# Patient Record
Sex: Female | Born: 1965 | Race: Black or African American | Hispanic: No | Smoking: Never smoker
Health system: Southern US, Community
[De-identification: ages and names within clinical notes are randomized; demographics above are authoritative.]

## PROBLEM LIST (undated history)

## (undated) DIAGNOSIS — C50919 Malignant neoplasm of unspecified site of unspecified female breast: Secondary | ICD-10-CM

## (undated) HISTORY — PX: TUBAL LIGATION: SHX77

## (undated) HISTORY — PX: WRIST FRACTURE SURGERY: SHX121

---

## 2005-12-08 ENCOUNTER — Other Ambulatory Visit: Admission: RE | Admit: 2005-12-08 | Discharge: 2005-12-08 | Payer: Self-pay | Admitting: Obstetrics and Gynecology

## 2005-12-30 ENCOUNTER — Encounter: Admission: RE | Admit: 2005-12-30 | Discharge: 2005-12-30 | Payer: Self-pay | Admitting: Obstetrics and Gynecology

## 2006-11-23 ENCOUNTER — Emergency Department (HOSPITAL_COMMUNITY): Admission: EM | Admit: 2006-11-23 | Discharge: 2006-11-23 | Payer: Self-pay | Admitting: Emergency Medicine

## 2008-01-23 ENCOUNTER — Encounter: Admission: RE | Admit: 2008-01-23 | Discharge: 2008-01-23 | Payer: Self-pay | Admitting: Family Medicine

## 2008-01-24 ENCOUNTER — Ambulatory Visit (HOSPITAL_COMMUNITY): Admission: RE | Admit: 2008-01-24 | Discharge: 2008-01-24 | Payer: Self-pay | Admitting: Family Medicine

## 2008-04-03 ENCOUNTER — Other Ambulatory Visit: Admission: RE | Admit: 2008-04-03 | Discharge: 2008-04-03 | Payer: Self-pay | Admitting: Family Medicine

## 2009-08-26 ENCOUNTER — Ambulatory Visit (HOSPITAL_COMMUNITY): Admission: RE | Admit: 2009-08-26 | Discharge: 2009-08-26 | Payer: Self-pay | Admitting: Emergency Medicine

## 2010-01-25 ENCOUNTER — Emergency Department (HOSPITAL_COMMUNITY)
Admission: EM | Admit: 2010-01-25 | Discharge: 2010-01-25 | Payer: Self-pay | Source: Home / Self Care | Admitting: Emergency Medicine

## 2010-11-05 LAB — DIFFERENTIAL
Basophils Relative: 0
Eosinophils Relative: 2
Lymphocytes Relative: 22
Lymphs Abs: 1.3
Monocytes Absolute: 0.3

## 2010-11-05 LAB — POCT I-STAT CREATININE: Operator id: 288831

## 2010-11-05 LAB — CBC
Hemoglobin: 10.2 — ABNORMAL LOW
RDW: 22.5 — ABNORMAL HIGH
WBC: 5.7

## 2010-11-05 LAB — I-STAT 8, (EC8 V) (CONVERTED LAB)
BUN: 11
Bicarbonate: 26.6 — ABNORMAL HIGH
Sodium: 139

## 2010-11-05 LAB — POCT CARDIAC MARKERS
CKMB, poc: <1 — ABNORMAL LOW
Troponin i, poc: <0.05

## 2011-06-05 ENCOUNTER — Other Ambulatory Visit: Payer: Self-pay | Admitting: Internal Medicine

## 2011-06-05 ENCOUNTER — Inpatient Hospital Stay (HOSPITAL_COMMUNITY): Admission: RE | Admit: 2011-06-05 | Payer: Self-pay | Source: Ambulatory Visit

## 2011-06-05 ENCOUNTER — Other Ambulatory Visit (HOSPITAL_COMMUNITY)
Admission: RE | Admit: 2011-06-05 | Discharge: 2011-06-05 | Disposition: A | Discharge status: Home / Self Care | Payer: BC Managed Care – PPO | Source: Ambulatory Visit | Attending: Internal Medicine | Admitting: Internal Medicine

## 2011-06-05 DIAGNOSIS — Z1231 Encounter for screening mammogram for malignant neoplasm of breast: Secondary | ICD-10-CM

## 2011-06-05 DIAGNOSIS — Z01419 Encounter for gynecological examination (general) (routine) without abnormal findings: Secondary | ICD-10-CM | POA: Insufficient documentation

## 2011-06-05 DIAGNOSIS — Z1159 Encounter for screening for other viral diseases: Secondary | ICD-10-CM | POA: Insufficient documentation

## 2011-06-15 ENCOUNTER — Ambulatory Visit
Admission: RE | Admit: 2011-06-15 | Discharge: 2011-06-15 | Disposition: A | Discharge status: Home / Self Care | Payer: BC Managed Care – PPO | Source: Ambulatory Visit | Attending: Internal Medicine | Admitting: Internal Medicine

## 2011-06-15 DIAGNOSIS — Z1231 Encounter for screening mammogram for malignant neoplasm of breast: Secondary | ICD-10-CM

## 2012-09-01 ENCOUNTER — Other Ambulatory Visit: Payer: Self-pay

## 2012-09-09 ENCOUNTER — Other Ambulatory Visit: Payer: Self-pay

## 2012-09-09 DIAGNOSIS — Z1231 Encounter for screening mammogram for malignant neoplasm of breast: Secondary | ICD-10-CM

## 2012-09-12 ENCOUNTER — Ambulatory Visit
Admission: RE | Admit: 2012-09-12 | Discharge: 2012-09-12 | Disposition: A | Discharge status: Home / Self Care | Payer: BC Managed Care – PPO | Source: Ambulatory Visit | Attending: Geriatric Medicine | Admitting: Geriatric Medicine

## 2012-09-12 ENCOUNTER — Other Ambulatory Visit: Payer: Self-pay | Admitting: Geriatric Medicine

## 2012-09-12 DIAGNOSIS — R1310 Dysphagia, unspecified: Secondary | ICD-10-CM

## 2012-09-23 ENCOUNTER — Ambulatory Visit
Admission: RE | Admit: 2012-09-23 | Discharge: 2012-09-23 | Disposition: A | Discharge status: Home / Self Care | Payer: BC Managed Care – PPO | Source: Ambulatory Visit

## 2012-09-23 DIAGNOSIS — Z1231 Encounter for screening mammogram for malignant neoplasm of breast: Secondary | ICD-10-CM

## 2013-11-10 ENCOUNTER — Other Ambulatory Visit: Payer: Self-pay

## 2013-11-10 DIAGNOSIS — Z1231 Encounter for screening mammogram for malignant neoplasm of breast: Secondary | ICD-10-CM

## 2013-11-27 ENCOUNTER — Ambulatory Visit
Admission: RE | Admit: 2013-11-27 | Discharge: 2013-11-27 | Disposition: A | Discharge status: Home / Self Care | Payer: BC Managed Care – PPO | Source: Ambulatory Visit

## 2013-11-27 DIAGNOSIS — Z1231 Encounter for screening mammogram for malignant neoplasm of breast: Secondary | ICD-10-CM

## 2014-07-09 ENCOUNTER — Other Ambulatory Visit (HOSPITAL_COMMUNITY): Payer: Self-pay | Admitting: Gastroenterology

## 2014-07-09 DIAGNOSIS — R1314 Dysphagia, pharyngoesophageal phase: Secondary | ICD-10-CM

## 2014-07-23 ENCOUNTER — Ambulatory Visit (HOSPITAL_COMMUNITY)
Admission: RE | Admit: 2014-07-23 | Discharge: 2014-07-23 | Disposition: A | Discharge status: Home / Self Care | Payer: BC Managed Care – PPO | Source: Ambulatory Visit | Attending: Gastroenterology | Admitting: Gastroenterology

## 2014-07-23 DIAGNOSIS — R1314 Dysphagia, pharyngoesophageal phase: Secondary | ICD-10-CM | POA: Insufficient documentation

## 2015-01-22 ENCOUNTER — Other Ambulatory Visit: Payer: Self-pay

## 2015-01-22 DIAGNOSIS — Z1231 Encounter for screening mammogram for malignant neoplasm of breast: Secondary | ICD-10-CM

## 2015-02-15 ENCOUNTER — Other Ambulatory Visit: Payer: Self-pay | Admitting: Internal Medicine

## 2015-02-15 ENCOUNTER — Other Ambulatory Visit (HOSPITAL_COMMUNITY)
Admission: RE | Admit: 2015-02-15 | Discharge: 2015-02-15 | Disposition: A | Discharge status: Home / Self Care | Payer: BC Managed Care – PPO | Source: Ambulatory Visit | Attending: Internal Medicine | Admitting: Internal Medicine

## 2015-02-15 DIAGNOSIS — Z1151 Encounter for screening for human papillomavirus (HPV): Secondary | ICD-10-CM | POA: Diagnosis present

## 2015-02-15 DIAGNOSIS — Z01419 Encounter for gynecological examination (general) (routine) without abnormal findings: Secondary | ICD-10-CM | POA: Insufficient documentation

## 2015-02-18 ENCOUNTER — Ambulatory Visit
Admission: RE | Admit: 2015-02-18 | Discharge: 2015-02-18 | Disposition: A | Discharge status: Home / Self Care | Payer: BC Managed Care – PPO | Source: Ambulatory Visit

## 2015-02-18 DIAGNOSIS — Z1231 Encounter for screening mammogram for malignant neoplasm of breast: Secondary | ICD-10-CM

## 2015-02-19 LAB — CYTOLOGY - PAP

## 2021-02-04 ENCOUNTER — Other Ambulatory Visit: Payer: Self-pay | Admitting: Family Medicine

## 2021-02-04 DIAGNOSIS — N6321 Unspecified lump in the left breast, upper outer quadrant: Secondary | ICD-10-CM

## 2021-02-19 ENCOUNTER — Other Ambulatory Visit: Payer: Self-pay | Admitting: Hematology and Oncology

## 2021-03-06 ENCOUNTER — Other Ambulatory Visit: Payer: Self-pay | Admitting: Licensed Clinical Social Worker

## 2021-03-06 ENCOUNTER — Encounter: Payer: Self-pay | Admitting: *Deleted

## 2021-03-06 ENCOUNTER — Telehealth: Payer: Self-pay | Admitting: Radiation Oncology

## 2021-03-06 ENCOUNTER — Telehealth: Payer: Self-pay | Admitting: *Deleted

## 2021-03-06 ENCOUNTER — Telehealth: Payer: Self-pay | Admitting: Hematology and Oncology

## 2021-03-06 DIAGNOSIS — Z171 Estrogen receptor negative status [ER-]: Secondary | ICD-10-CM

## 2021-03-06 DIAGNOSIS — C50412 Malignant neoplasm of upper-outer quadrant of left female breast: Secondary | ICD-10-CM

## 2021-03-06 NOTE — Telephone Encounter (Signed)
I called the pt several more times about her scheduled appts with Dr. Lindi Adie and genetics. Was not able to reach the pt, the phone line kept ringing until I eventually got a busy signal. This happened each time I called. I informed the nurse navigators to let them know.

## 2021-03-06 NOTE — Telephone Encounter (Signed)
Received referral from [place], nurse navigator at [facility]. Per Dr. Lindi Adie pt can be seen tomorrow 2/10 at 12:30. Provided Tori with appt date and time as well as requested new pt coordinator to reach out to pt.

## 2021-03-06 NOTE — Progress Notes (Signed)
error 

## 2021-03-06 NOTE — Telephone Encounter (Signed)
Called patient to schedule a consultation w. Dr. Sondra Come. No answer, unable to LVM.

## 2021-03-06 NOTE — Progress Notes (Incomplete)
[facility] NOTE  Patient Care Team: Mauro Kaufmann, RN as Oncology Nurse Navigator Rockwell Germany, RN as Oncology Nurse Navigator  CHIEF COMPLAINTS/PURPOSE OF CONSULTATION:  Newly diagnosed left breast cancer  HISTORY OF PRESENTING ILLNESS:  Amanda Smith 56 y.o. female is here because of recent diagnosis of left breast cancer. She presented with a palpable solid mass in the left upper outer quadrant. Diagnostic mammogram on 02/12/2021 showed a 3 cm mass in the left upper outer quadrant. 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 She presents to the clinic today for initial evaluation and discussion of treatment options.   I reviewed her records extensively and collaborated the history with the patient.  SUMMARY OF ONCOLOGIC HISTORY: Oncology History   No history exists.    MEDICAL HISTORY:  No past medical history on file.  SURGICAL HISTORY: *** The histories are not reviewed yet. Please review them in the "History" navigator section and refresh this [place].  SOCIAL HISTORY: Social History   Socioeconomic History   Marital status: Married    Spouse name: Not on file   Number of children: Not on file   Years of education: Not on file   Highest education level: Not on file  Occupational History   Not on file  Tobacco Use   Smoking status: Not on file   Smokeless tobacco: Not on file  Substance and Sexual Activity   Alcohol use: Not on file   Drug use: Not on file   Sexual activity: Not on file  Other Topics Concern   Not on file  Social History Narrative   Not on file   Social Determinants of Health   Financial Resource Strain: Not on file  Food Insecurity: Not on file  Transportation Needs: Not on file  Physical Activity: Not on file  Stress: Not on file  Social Connections: Not on file  Intimate Partner Violence: Not on file    FAMILY HISTORY: No family history on file.  ALLERGIES:  has No  Known Allergies.  MEDICATIONS:  No current outpatient medications on file.   No current facility-administered medications for this visit.    REVIEW OF SYSTEMS:   Constitutional: Denies fevers, chills or abnormal night sweats Eyes: Denies blurriness of vision, double vision or watery eyes Ears, nose, mouth, throat, and face: Denies mucositis or sore throat Respiratory: Denies cough, dyspnea or wheezes Cardiovascular: Denies palpitation, chest discomfort or lower extremity swelling Gastrointestinal:  Denies nausea, heartburn or change in bowel habits Skin: Denies abnormal skin rashes Lymphatics: Denies new lymphadenopathy or easy bruising Neurological:Denies numbness, tingling or new weaknesses Behavioral/Psych: Mood is stable, no new changes  Breast: *** Denies any palpable lumps or discharge All other systems were reviewed with the patient and are negative.  PHYSICAL EXAMINATION: ECOG PERFORMANCE STATUS: {CHL ONC ECOG PS:[id number]}  There were no vitals filed for this visit. There were no vitals filed for this visit.  GENERAL:alert, no distress and comfortable SKIN: skin color, texture, turgor are normal, no rashes or significant lesions EYES: normal, conjunctiva are pink and non-injected, sclera clear OROPHARYNX:no exudate, no erythema and lips, buccal mucosa, and tongue normal  NECK: supple, thyroid normal size, non-tender, without nodularity LYMPH:  no palpable lymphadenopathy in the cervical, axillary or inguinal LUNGS: clear to auscultation and percussion with normal breathing effort HEART: regular rate & rhythm and no murmurs and no lower extremity edema ABDOMEN:abdomen soft, non-tender and normal bowel sounds Musculoskeletal:no cyanosis of digits and no clubbing  PSYCH: alert & oriented x 3 with fluent speech NEURO: no focal motor/sensory deficits BREAST:*** No palpable nodules in breast. No palpable axillary or supraclavicular lymphadenopathy (exam performed in the  presence of a chaperone)   LABORATORY DATA:  I have reviewed the data as listed Lab Results  Component Value Date   WBC 5.7 11/23/2006   HGB 12.6 11/23/2006   HCT 37.0 11/23/2006   MCV 81.1 11/23/2006   PLT 251 11/23/2006   Lab Results  Component Value Date   NA 139 11/23/2006   K 4.2 11/23/2006   CL 107 11/23/2006    RADIOGRAPHIC STUDIES: I have personally reviewed the radiological reports and agreed with the findings in the report.  ASSESSMENT AND PLAN:  No problem-specific Assessment & Plan notes found for this encounter.   All questions were answered. The patient knows to call the clinic with any problems, questions or concerns.   Rulon Eisenmenger, MD, MPH 03/06/2021    I, Thana Ates, am acting as scribe for Nicholas Lose, MD.  {Add scribe attestation statement}

## 2021-03-07 ENCOUNTER — Telehealth: Payer: Self-pay | Admitting: *Deleted

## 2021-03-07 ENCOUNTER — Telehealth: Payer: Self-pay | Admitting: Radiation Oncology

## 2021-03-07 ENCOUNTER — Encounter: Payer: Self-pay | Admitting: Hematology and Oncology

## 2021-03-07 ENCOUNTER — Other Ambulatory Visit: Payer: Self-pay

## 2021-03-07 ENCOUNTER — Inpatient Hospital Stay: Payer: PRIVATE HEALTH INSURANCE | Admitting: Hematology and Oncology

## 2021-03-07 ENCOUNTER — Encounter: Payer: Self-pay | Admitting: *Deleted

## 2021-03-07 ENCOUNTER — Inpatient Hospital Stay: Payer: PRIVATE HEALTH INSURANCE | Attending: Hematology and Oncology | Admitting: Hematology and Oncology

## 2021-03-07 VITALS — BP 91/65 | HR 96 | Temp 97.9°F | Resp 16 | Ht 64.0 in | Wt 117.0 lb

## 2021-03-07 DIAGNOSIS — C50412 Malignant neoplasm of upper-outer quadrant of left female breast: Secondary | ICD-10-CM | POA: Insufficient documentation

## 2021-03-07 DIAGNOSIS — Z171 Estrogen receptor negative status [ER-]: Secondary | ICD-10-CM | POA: Insufficient documentation

## 2021-03-07 NOTE — Telephone Encounter (Signed)
Called patient to schedule a consultation w. Dr. Sondra Come. No answer, unable to LVM.

## 2021-03-07 NOTE — Telephone Encounter (Signed)
Several attempts made to reach pt to discuss new pt appt with Dr. Lindi Adie on 2/10 at 12:30. Reached out to Engineer, manufacturing at [facility], she was able to reach the pt and requested pt return our calls. Pt did call back, provided appt with Dr. Lindi Adie on 2/10 at 12:30. After much discussion, pt informed she prefer to see a female doctor but would keep appt with Dr. Lindi Adie if appt couldn't be make. Dr. Chryl Heck agree to see pt on 2/10 at 12:30pm. Provided pt with this information.  No further needs or questions at this time.

## 2021-03-07 NOTE — Progress Notes (Signed)
[facility] CONSULT NOTE  Patient Care Team: Pa, [place] as PCP - General (Internal Medicine) Mauro Kaufmann, RN as Oncology Nurse Navigator Rockwell Germany, RN as Oncology Nurse Navigator  CHIEF COMPLAINTS/PURPOSE OF CONSULTATION:  Newly diagnosed breast cancer  HISTORY OF PRESENTING ILLNESS:  Amanda Smith 56 y.o. female is here because of recent diagnosis of left breast cancer  I reviewed her records extensively and collaborated the history with the patient.  SUMMARY OF ONCOLOGIC HISTORY: Oncology History  Malignant neoplasm of upper-outer quadrant of left breast in female, estrogen receptor negative ([facility])  02/12/2021 Initial Diagnosis   palpable solid mass in the left upper outer quadrant. Diagnostic mammogram on 02/12/2021 showed a 3 cm mass in the left upper outer quadrant. Biopsy on 02/20/2021 showed grade 3 invasive ductal carcinoma, ER/PR/Her2-([facility])   03/07/2021 Cancer Staging   Staging form: Breast, AJCC 8th Edition - Clinical stage from 03/07/2021: Stage IIB (cT2, cN0, cM0, G3, ER-, PR-, HER2-) - Signed by Nicholas Lose, MD on 03/07/2021 Stage prefix: Initial diagnosis Histologic grading system: 3 grade system    03/14/2021 -  Chemotherapy   Patient is on Treatment Plan : BREAST Pembrolizumab (200) D1 + Carboplatin (1.5) D1,8,15 + Paclitaxel (80) D1,8,15 q21d X 4 cycles / Pembrolizumab (200) D1 + AC D1 q21d x 4 cycles      [place] left axilla normal with no adenopathy identified. She has felt this lump in the left breast back in December, according to the patient this has grown progressively larger.  She has also lost some weight but this could be because of the change in her dietary habits.  Besides this, she denies any other new complaints.  She is healthy otherwise, works as a Pharmacist, hospital, has her own business.   Family history significant for breast cancer in sister who died at the age of 47 with metastatic breast cancer.  MEDICAL HISTORY:   History reviewed. No pertinent past medical history. No PMH noted.  SURGICAL HISTORY:  She had C sections, no other major surgeries.  SOCIAL HISTORY: Social History   Socioeconomic History   Marital status: Married    Spouse name: Not on file   Number of children: Not on file   Years of education: Not on file   Highest education level: Not on file  Occupational History   Not on file  Tobacco Use   Smoking status: Not on file   Smokeless tobacco: Not on file  Substance and Sexual Activity   Alcohol use: Not on file   Drug use: Not on file   Sexual activity: Not on file  Other Topics Concern   Not on file  Social History Narrative   Not on file   Social Determinants of Health   Financial Resource Strain: Not on file  Food Insecurity: Not on file  Transportation Needs: Not on file  Physical Activity: Not on file  Stress: Not on file  Social Connections: Not on file  Intimate Partner Violence: Not on file    FAMILY HISTORY: Sister had breast cancer, died at 34 from breast cancer NO FH of ovarian cancer, female breast cancer, metastatic prostate cancer.  ALLERGIES:  has No Known Allergies.  MEDICATIONS:  No current outpatient medications on file.   No current facility-administered medications for this visit.    REVIEW OF SYSTEMS:   Constitutional: Denies fevers, chills or abnormal night sweats Eyes: Denies blurriness of vision, double vision or watery eyes Ears, nose, mouth, throat, and  face: Denies mucositis or sore throat Respiratory: Denies cough, dyspnea or wheezes Cardiovascular: Denies palpitation, chest discomfort or lower extremity swelling Gastrointestinal:  Denies nausea, heartburn or change in bowel habits Skin: Denies abnormal skin rashes Lymphatics: Denies new lymphadenopathy or easy bruising Neurological:Denies numbness, tingling or new weaknesses Behavioral/Psych: Mood is stable, no new changes  Breast:  palpated left breast lump 2 months  ago All other systems were reviewed with the patient and are negative.  PHYSICAL EXAMINATION: ECOG PERFORMANCE STATUS: 0 - Asymptomatic  Vitals:   03/07/21 1248  BP: 91/65  Pulse: 96  Resp: 16  Temp: 97.9 F (36.6 C)  SpO2: 100%   Filed Weights   03/07/21 1248  Weight: 117 lb (53.1 kg)    GENERAL:alert, no distress and comfortable SKIN: skin color, texture, turgor are normal, no rashes or significant lesions EYES: normal, conjunctiva are pink and non-injected, sclera clear OROPHARYNX:no exudate, no erythema and lips, buccal mucosa, and tongue normal  NECK: supple, thyroid normal size, non-tender, without nodularity LYMPH:  no palpable lymphadenopathy in the cervical, axillary LUNGS: clear to auscultation and percussion with normal breathing effort HEART: regular rate & rhythm and no murmurs and no lower extremity edema ABDOMEN:abdomen soft, non-tender and normal bowel sounds Musculoskeletal:no cyanosis of digits and no clubbing  PSYCH: alert & oriented x 3 with fluent speech NEURO: no focal motor/sensory deficits BREAST: Palpable left breast upper outer lump measuring about 3-4 cms, palpable left axillary LN, likely non pathologic. NO other regional adenopathy  LABORATORY DATA:  I have reviewed the data as listed Lab Results  Component Value Date   WBC 5.7 11/23/2006   HGB 12.6 11/23/2006   HCT 37.0 11/23/2006   MCV 81.1 11/23/2006   PLT 251 11/23/2006   Lab Results  Component Value Date   NA 139 11/23/2006   K 4.2 11/23/2006   CL 107 11/23/2006    RADIOGRAPHIC STUDIES: I have personally reviewed the radiological reports and agreed with the findings in the report.  ASSESSMENT AND PLAN:  This is a very plan 56 year old postmenopausal female patient with newly diagnosed left breast triple negative invasive ductal carcinoma measuring about 3 cm on imaging, no evidence of pathological left axillary lymphadenopathy on imaging referred to medical oncology for  recommendations.  Pathology and radiology counseling: Discussed with the patient, the details of pathology including the type of breast cancer,the clinical staging, the significance of ER, PR and HER-2/neu receptors and the implications for treatment. After reviewing the pathology in detail, we proceeded to discuss the different treatment options between surgery, radiation, chemotherapy, antiestrogen therapies.   Recommendation  1. Neoadjuvant chemotherapy with  Taxol weekly 12 with carboplatin and Keytruda every 3 weeks x4, followed by Adriamycin and Cytoxan with Keytruda every 3 weeks followed by Beryle Flock maintenance for 1 year ( Adjuvant 9 cycles) 2. Followed by breast conserving surgery with sentinel lymph node study 3. Followed by adjuvant radiation therapy No role for antiestrogen therapy since this is a triple negative breast cancer   Chemotherapy Counseling: I discussed the risks and benefits of chemotherapy including the risks of nausea/ vomiting, risk of infection from low WBC count, fatigue due to chemo or anemia, bruising or bleeding due to low platelets, mouth sores, loss/ change in taste and decreased appetite. Liver and kidney function will be monitored through out chemotherapy as abnormalities in liver and kidney function may be a side effect of treatment.  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  Risk of permanent bone marrow dysfunction due to chemo were also discussed.  We also discussed the immune mediated adverse effects related to [facility]   Plan: 1. Port placement to be done 2. Echocardiogram 3. Chemotherapy class 4. Breast MRI  Genetic counseling recommended.    Return to clinic in 2 weeks to start chemotherapy. She said its important for her to have a provider who is a christian like her and she would like think about all the information provided and will let us know if she would like to proceed with the  recommendations as mentioned above.   All questions were answered. The patient knows to call the clinic with any problems, questions or concerns.    Benay Pike, MD 03/07/21

## 2021-03-07 NOTE — Assessment & Plan Note (Deleted)
02/12/2021:palpable solid mass in the left upper outer quadrant. Diagnostic mammogram on 02/12/2021 showed a 3 cm mass in the left upper outer quadrant. Biopsy on 02/20/2021 showed grade 3 invasive ductal carcinoma, ER/PR/Her2-([facility])  Pathology and radiology counseling: Discussed with the patient, the details of pathology including the type of breast cancer,the clinical staging, the significance of ER, PR and HER-2/neu receptors and the implications for treatment. After reviewing the pathology in detail, we proceeded to discuss the different treatment options between surgery, radiation, chemotherapy, antiestrogen therapies.  Recommendation based on multidisciplinary tumor board: 1. Neoadjuvant chemotherapy with Adriamycin and Cytoxan Keytruda 4 followed by Taxol weekly 12 with carboplatin Keytruda every 3 weeks x4 followed by Beryle Flock maintenance 2. Followed by breast conserving surgery with sentinel lymph node study vs targeted axillary dissection 3. Followed by adjuvant radiation therapy  Chemotherapy Counseling: I discussed the risks and benefits of chemotherapy including the risks of nausea/ vomiting, risk of infection from low WBC count, fatigue due to chemo or anemia, bruising or bleeding due to low platelets, mouth sores, loss/ change in taste and decreased appetite. Liver and kidney function will be monitored through out chemotherapy as abnormalities in liver and kidney function may be a side effect of treatment.  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 Risk of permanent bone marrow dysfunction due to chemo were also discussed.  Plan: 1. Port placement to be done next Monday 2. Echocardiogram 3. Chemotherapy class 4. Breast MRI 5. CT chest abdomen pelvis and bone scan for staging Genetic counseling will also be arranged  URCC 16070: Treatment of refractory nausea.  After first cycle of chemo if patient experience chemo  induced nausea and vomiting the randomized from cycle 2 to Aloxi plus Dex plus olanzapine or placebo plus Compazine or placebo plus placebo prior to chemo and take home medications for day 2 today for of Dex plus olanzapine or placebo and Compazine or placebo every 8 hours.  If patient does not have nausea after cycle 1, then the trial is complete.  Return to clinic in 1 week to start chemotherapy.

## 2021-03-07 NOTE — Progress Notes (Signed)
START ON PATHWAY REGIMEN - Breast     Cycles 1 through 4: A cycle is every 21 days:     Pembrolizumab      Paclitaxel      Carboplatin      Filgrastim-xxxx    Cycles 5 through 8: A cycle is every 21 days:     Pembrolizumab      Doxorubicin      Cyclophosphamide      Pegfilgrastim-xxxx   **Always confirm dose/schedule in your pharmacy ordering system**  Patient Characteristics: Preoperative or Nonsurgical Candidate (Clinical Staging), Neoadjuvant Therapy followed by Surgery, Invasive Disease, Chemotherapy, HER2 Negative/Unknown/Equivocal, ER Negative/Unknown, Platinum Therapy Indicated and Candidate for Checkpoint Inhibitor Therapeutic Status: Preoperative or Nonsurgical Candidate (Clinical Staging) AJCC M Category: cM0 AJCC Grade: G3 Breast Surgical Plan: Neoadjuvant Therapy followed by Surgery ER Status: Negative (-) AJCC 8 Stage Grouping: IIB HER2 Status: Negative (-) AJCC T Category: cT2 AJCC N Category: cN0 PR Status: Negative (-) Intent of Therapy: Curative Intent, Discussed with Patient

## 2021-03-07 NOTE — Telephone Encounter (Signed)
Called pt to ensure she was coming to her appointment to see Dr. Chryl Heck. No answer and unable to leave a vm. Sent email to verified email as well. No response.

## 2021-03-10 ENCOUNTER — Ambulatory Visit
Admission: RE | Admit: 2021-03-10 | Discharge: 2021-03-10 | Disposition: A | Discharge status: Home / Self Care | Payer: Self-pay | Source: Ambulatory Visit | Attending: Radiation Oncology | Admitting: Radiation Oncology

## 2021-03-10 ENCOUNTER — Inpatient Hospital Stay
Admission: RE | Admit: 2021-03-10 | Discharge: 2021-03-10 | Disposition: A | Discharge status: Home / Self Care | Payer: Self-pay | Source: Ambulatory Visit | Attending: Radiation Oncology | Admitting: Radiation Oncology

## 2021-03-10 ENCOUNTER — Inpatient Hospital Stay: Payer: PRIVATE HEALTH INSURANCE

## 2021-03-10 ENCOUNTER — Other Ambulatory Visit: Payer: Self-pay | Admitting: Radiation Oncology

## 2021-03-10 ENCOUNTER — Inpatient Hospital Stay: Payer: PRIVATE HEALTH INSURANCE | Admitting: Licensed Clinical Social Worker

## 2021-03-10 ENCOUNTER — Other Ambulatory Visit: Payer: BC Managed Care – PPO

## 2021-03-10 ENCOUNTER — Encounter: Payer: Self-pay | Admitting: *Deleted

## 2021-03-10 DIAGNOSIS — C50412 Malignant neoplasm of upper-outer quadrant of left female breast: Secondary | ICD-10-CM

## 2021-03-10 DIAGNOSIS — Z171 Estrogen receptor negative status [ER-]: Secondary | ICD-10-CM

## 2021-03-11 ENCOUNTER — Telehealth: Payer: Self-pay | Admitting: *Deleted

## 2021-03-11 NOTE — Telephone Encounter (Signed)
Spoke to pt regarding provider. She would like to stay with Dr. Chryl Heck. Pt unable to attend appt on 2/21 at 4pm. Scheduled and confirmed new time of 8:30am. Discussed appt with Dr. Donne Hazel on 2/17. No further needs voiced at this time.

## 2021-03-11 NOTE — Telephone Encounter (Signed)
Pt left vm to further discuss med onc options. Return pt call. Left vm requesting return call. Contact information provided.

## 2021-03-14 ENCOUNTER — Encounter: Payer: Self-pay | Admitting: *Deleted

## 2021-03-14 ENCOUNTER — Telehealth: Payer: Self-pay | Admitting: *Deleted

## 2021-03-14 ENCOUNTER — Other Ambulatory Visit: Payer: Self-pay | Admitting: General Surgery

## 2021-03-14 LAB — SURGICAL PATHOLOGY

## 2021-03-14 NOTE — Telephone Encounter (Signed)
Pt was no show for 2/13 genetics appt. While pt was at Dr. Cristal Generous office, genetic testing was performed.

## 2021-03-17 ENCOUNTER — Other Ambulatory Visit: Payer: Self-pay

## 2021-03-17 ENCOUNTER — Encounter (HOSPITAL_BASED_OUTPATIENT_CLINIC_OR_DEPARTMENT_OTHER): Payer: Self-pay | Admitting: General Surgery

## 2021-03-18 ENCOUNTER — Other Ambulatory Visit: Payer: Self-pay

## 2021-03-18 ENCOUNTER — Ambulatory Visit (HOSPITAL_COMMUNITY): Payer: Self-pay

## 2021-03-18 ENCOUNTER — Inpatient Hospital Stay (HOSPITAL_BASED_OUTPATIENT_CLINIC_OR_DEPARTMENT_OTHER): Payer: PRIVATE HEALTH INSURANCE | Admitting: Hematology and Oncology

## 2021-03-18 ENCOUNTER — Ambulatory Visit (HOSPITAL_BASED_OUTPATIENT_CLINIC_OR_DEPARTMENT_OTHER): Payer: Self-pay | Admitting: Anesthesiology

## 2021-03-18 ENCOUNTER — Other Ambulatory Visit: Payer: Self-pay | Admitting: Hematology and Oncology

## 2021-03-18 ENCOUNTER — Encounter (HOSPITAL_BASED_OUTPATIENT_CLINIC_OR_DEPARTMENT_OTHER): Payer: Self-pay | Admitting: General Surgery

## 2021-03-18 ENCOUNTER — Encounter: Payer: Self-pay | Admitting: Hematology and Oncology

## 2021-03-18 ENCOUNTER — Encounter (HOSPITAL_BASED_OUTPATIENT_CLINIC_OR_DEPARTMENT_OTHER)
Admission: RE | Disposition: A | Discharge status: Home / Self Care | Payer: Self-pay | Source: Home / Self Care | Attending: General Surgery

## 2021-03-18 ENCOUNTER — Ambulatory Visit: Payer: BC Managed Care – PPO | Admitting: Hematology and Oncology

## 2021-03-18 ENCOUNTER — Ambulatory Visit (HOSPITAL_BASED_OUTPATIENT_CLINIC_OR_DEPARTMENT_OTHER)
Admission: RE | Admit: 2021-03-18 | Discharge: 2021-03-18 | Disposition: A | Discharge status: Home / Self Care | Payer: Self-pay | Attending: General Surgery | Admitting: General Surgery

## 2021-03-18 ENCOUNTER — Telehealth: Payer: Self-pay | Admitting: Hematology and Oncology

## 2021-03-18 ENCOUNTER — Encounter: Payer: Self-pay | Admitting: *Deleted

## 2021-03-18 VITALS — BP 110/62 | HR 84 | Temp 97.9°F | Resp 16 | Ht 62.0 in | Wt 115.8 lb

## 2021-03-18 DIAGNOSIS — Z171 Estrogen receptor negative status [ER-]: Secondary | ICD-10-CM | POA: Insufficient documentation

## 2021-03-18 DIAGNOSIS — C50912 Malignant neoplasm of unspecified site of left female breast: Secondary | ICD-10-CM | POA: Diagnosis not present

## 2021-03-18 DIAGNOSIS — C50412 Malignant neoplasm of upper-outer quadrant of left female breast: Secondary | ICD-10-CM | POA: Diagnosis not present

## 2021-03-18 DIAGNOSIS — Z803 Family history of malignant neoplasm of breast: Secondary | ICD-10-CM | POA: Insufficient documentation

## 2021-03-18 HISTORY — PX: PORTACATH PLACEMENT: SHX2246

## 2021-03-18 HISTORY — DX: Malignant neoplasm of unspecified site of unspecified female breast: C50.919

## 2021-03-18 SURGERY — INSERTION, TUNNELED CENTRAL VENOUS DEVICE, WITH PORT
Anesthesia: General | Site: Chest | Laterality: Right

## 2021-03-18 MED ORDER — HEPARIN SOD (PORK) LOCK FLUSH 100 UNIT/ML IV SOLN
INTRAVENOUS | Status: DC | PRN
  Administered 2021-03-18: 4.5 [IU] via INTRAVENOUS

## 2021-03-18 MED ORDER — HEPARIN (PORCINE) IN NACL 2-0.9 UNITS/ML
INTRAMUSCULAR | Status: AC | PRN
Start: 2021-03-18 — End: 2021-03-18
  Administered 2021-03-18: 1

## 2021-03-18 MED ORDER — LIDOCAINE HCL (CARDIAC) PF 100 MG/5ML IV SOSY
PREFILLED_SYRINGE | INTRAVENOUS | Status: DC | PRN
Start: 2021-03-18 — End: 2021-03-18
  Administered 2021-03-18: 60 mg via INTRAVENOUS

## 2021-03-18 MED ORDER — AMISULPRIDE (ANTIEMETIC) 5 MG/2ML IV SOLN: 10.0000 mg | Freq: Once | INTRAVENOUS | Status: DC | PRN

## 2021-03-18 MED ORDER — HEPARIN SOD (PORK) LOCK FLUSH 100 UNIT/ML IV SOLN
INTRAVENOUS | Status: AC
  Filled 2021-03-18: qty 5

## 2021-03-18 MED ORDER — ONDANSETRON HCL 4 MG/2ML IJ SOLN
INTRAMUSCULAR | Status: AC
  Filled 2021-03-18: qty 2

## 2021-03-18 MED ORDER — CHLORHEXIDINE GLUCONATE CLOTH 2 % EX PADS: 6.0000 | MEDICATED_PAD | Freq: Once | CUTANEOUS | Status: DC

## 2021-03-18 MED ORDER — LIDOCAINE-PRILOCAINE 2.5-2.5 % EX CREA: TOPICAL_CREAM | CUTANEOUS | 3 refills | Status: AC

## 2021-03-18 MED ORDER — PHENYLEPHRINE HCL (PRESSORS) 10 MG/ML IV SOLN
INTRAVENOUS | Status: DC | PRN
  Administered 2021-03-18 (×2): 80 ug via INTRAVENOUS

## 2021-03-18 MED ORDER — LIDOCAINE 2% (20 MG/ML) 5 ML SYRINGE
INTRAMUSCULAR | Status: AC
  Filled 2021-03-18: qty 5

## 2021-03-18 MED ORDER — MEPERIDINE HCL 25 MG/ML IJ SOLN: 6.2500 mg | INTRAMUSCULAR | Status: DC | PRN

## 2021-03-18 MED ORDER — OXYCODONE HCL 5 MG PO TABS: 5.0000 mg | ORAL_TABLET | Freq: Once | ORAL | Status: DC | PRN

## 2021-03-18 MED ORDER — ACETAMINOPHEN 500 MG PO TABS
ORAL_TABLET | ORAL | Status: AC
  Filled 2021-03-18: qty 2

## 2021-03-18 MED ORDER — FENTANYL CITRATE (PF) 100 MCG/2ML IJ SOLN
INTRAMUSCULAR | Status: DC | PRN
  Administered 2021-03-18: 50 ug via INTRAVENOUS

## 2021-03-18 MED ORDER — PROPOFOL 10 MG/ML IV BOLUS
INTRAVENOUS | Status: DC | PRN
  Administered 2021-03-18: 150 mg via INTRAVENOUS

## 2021-03-18 MED ORDER — PROCHLORPERAZINE MALEATE 10 MG PO TABS: 10.0000 mg | ORAL_TABLET | Freq: Four times a day (QID) | ORAL | 1 refills | Status: AC | PRN

## 2021-03-18 MED ORDER — BUPIVACAINE HCL (PF) 0.25 % IJ SOLN
INTRAMUSCULAR | Status: DC | PRN
  Administered 2021-03-18: 5 mL

## 2021-03-18 MED ORDER — PROPOFOL 10 MG/ML IV BOLUS
INTRAVENOUS | Status: AC
  Filled 2021-03-18: qty 20

## 2021-03-18 MED ORDER — OXYCODONE HCL 5 MG/5ML PO SOLN: 5.0000 mg | Freq: Once | ORAL | Status: DC | PRN

## 2021-03-18 MED ORDER — DEXAMETHASONE SODIUM PHOSPHATE 4 MG/ML IJ SOLN
INTRAMUSCULAR | Status: DC | PRN
  Administered 2021-03-18: 4 mg via INTRAVENOUS

## 2021-03-18 MED ORDER — ONDANSETRON HCL 8 MG PO TABS: 8.0000 mg | ORAL_TABLET | Freq: Two times a day (BID) | ORAL | 1 refills | Status: AC | PRN

## 2021-03-18 MED ORDER — TRAMADOL HCL 50 MG PO TABS: 100.0000 mg | ORAL_TABLET | Freq: Four times a day (QID) | ORAL | 0 refills | Status: AC | PRN

## 2021-03-18 MED ORDER — MIDAZOLAM HCL 2 MG/2ML IJ SOLN
INTRAMUSCULAR | Status: AC
  Filled 2021-03-18: qty 2

## 2021-03-18 MED ORDER — MIDAZOLAM HCL 5 MG/5ML IJ SOLN
INTRAMUSCULAR | Status: DC | PRN
Start: 2021-03-18 — End: 2021-03-18
  Administered 2021-03-18: 2 mg via INTRAVENOUS

## 2021-03-18 MED ORDER — FENTANYL CITRATE (PF) 100 MCG/2ML IJ SOLN
INTRAMUSCULAR | Status: AC
  Filled 2021-03-18: qty 2

## 2021-03-18 MED ORDER — ENSURE PRE-SURGERY PO LIQD: 296.0000 mL | Freq: Once | ORAL | Status: DC

## 2021-03-18 MED ORDER — HEPARIN (PORCINE) IN NACL 1000-0.9 UT/500ML-% IV SOLN
INTRAVENOUS | Status: AC
  Filled 2021-03-18: qty 500

## 2021-03-18 MED ORDER — CEFAZOLIN SODIUM-DEXTROSE 2-4 GM/100ML-% IV SOLN
2.0000 g | INTRAVENOUS | Status: AC
Start: 2021-03-19 — End: 2021-03-18
  Administered 2021-03-18: 2 g via INTRAVENOUS

## 2021-03-18 MED ORDER — PROMETHAZINE HCL 25 MG/ML IJ SOLN: 6.2500 mg | INTRAMUSCULAR | Status: DC | PRN

## 2021-03-18 MED ORDER — DEXAMETHASONE 4 MG PO TABS: 8.0000 mg | ORAL_TABLET | Freq: Every day | ORAL | 1 refills | Status: AC

## 2021-03-18 MED ORDER — ACETAMINOPHEN 500 MG PO TABS
1000.0000 mg | ORAL_TABLET | ORAL | Status: AC
  Administered 2021-03-18: 1000 mg via ORAL

## 2021-03-18 MED ORDER — EPHEDRINE SULFATE (PRESSORS) 50 MG/ML IJ SOLN
INTRAMUSCULAR | Status: DC | PRN
Start: 2021-03-18 — End: 2021-03-18
  Administered 2021-03-18: 10 mg via INTRAVENOUS

## 2021-03-18 MED ORDER — CEFAZOLIN SODIUM-DEXTROSE 2-4 GM/100ML-% IV SOLN
INTRAVENOUS | Status: AC
  Filled 2021-03-18: qty 100

## 2021-03-18 MED ORDER — BUPIVACAINE HCL (PF) 0.25 % IJ SOLN
INTRAMUSCULAR | Status: AC
  Filled 2021-03-18: qty 30

## 2021-03-18 MED ORDER — HYDROMORPHONE HCL 1 MG/ML IJ SOLN: 0.2500 mg | INTRAMUSCULAR | Status: DC | PRN

## 2021-03-18 MED ORDER — DEXAMETHASONE SODIUM PHOSPHATE 10 MG/ML IJ SOLN
INTRAMUSCULAR | Status: AC
  Filled 2021-03-18: qty 1

## 2021-03-18 MED ORDER — LACTATED RINGERS IV SOLN: INTRAVENOUS | Status: DC

## 2021-03-18 SURGICAL SUPPLY — 36 items
ADH SKN CLS APL DERMABOND .7 (GAUZE/BANDAGES/DRESSINGS) ×1
APL PRP STRL LF DISP 70% ISPRP (MISCELLANEOUS) ×1
BAG DECANTER FOR FLEXI CONT (MISCELLANEOUS) ×3 IMPLANT
BLADE SURG 11 STRL SS (BLADE) ×3 IMPLANT
BLADE SURG 15 STRL LF DISP TIS (BLADE) ×2 IMPLANT
BLADE SURG 15 STRL SS (BLADE) ×2
CHLORAPREP W/TINT 26 (MISCELLANEOUS) ×3 IMPLANT
COVER BACK TABLE 60X90IN (DRAPES) ×3 IMPLANT
COVER MAYO STAND STRL (DRAPES) ×3 IMPLANT
COVER PROBE 5X48 (MISCELLANEOUS) ×2
DERMABOND ADVANCED (GAUZE/BANDAGES/DRESSINGS) ×1
DERMABOND ADVANCED .7 DNX12 (GAUZE/BANDAGES/DRESSINGS) ×2 IMPLANT
DRAPE C-ARM 42X72 X-RAY (DRAPES) ×3 IMPLANT
DRAPE LAPAROSCOPIC ABDOMINAL (DRAPES) ×3 IMPLANT
DRAPE UTILITY XL STRL (DRAPES) ×3 IMPLANT
ELECT COATED BLADE 2.86 ST (ELECTRODE) ×3 IMPLANT
ELECT REM PT RETURN 9FT ADLT (ELECTROSURGICAL) ×2
ELECTRODE REM PT RTRN 9FT ADLT (ELECTROSURGICAL) ×2 IMPLANT
GLOVE SURG ENC MOIS LTX SZ7 (GLOVE) ×3 IMPLANT
GLOVE SURG UNDER POLY LF SZ7.5 (GLOVE) ×3 IMPLANT
GOWN STRL REUS W/ TWL LRG LVL3 (GOWN DISPOSABLE) ×4 IMPLANT
GOWN STRL REUS W/TWL LRG LVL3 (GOWN DISPOSABLE) ×4
KIT CVR 48X5XPRB PLUP LF (MISCELLANEOUS) IMPLANT
KIT PORT POWER 8FR ISP CVUE (Port) ×1 IMPLANT
NDL HYPO 25X1 1.5 SAFETY (NEEDLE) ×2 IMPLANT
NEEDLE HYPO 25X1 1.5 SAFETY (NEEDLE) ×2 IMPLANT
PACK BASIN DAY SURGERY FS (CUSTOM PROCEDURE TRAY) ×3 IMPLANT
PENCIL SMOKE EVACUATOR (MISCELLANEOUS) ×3 IMPLANT
SLEEVE SCD COMPRESS KNEE MED (STOCKING) ×3 IMPLANT
SUT MNCRL AB 4-0 PS2 18 (SUTURE) ×3 IMPLANT
SUT PROLENE 2 0 SH DA (SUTURE) ×3 IMPLANT
SUT VIC AB 3-0 SH 27 (SUTURE) ×2
SUT VIC AB 3-0 SH 27X BRD (SUTURE) ×2 IMPLANT
SYR 5ML LUER SLIP (SYRINGE) ×3 IMPLANT
SYR CONTROL 10ML LL (SYRINGE) ×3 IMPLANT
TOWEL GREEN STERILE FF (TOWEL DISPOSABLE) ×3 IMPLANT

## 2021-03-18 NOTE — Progress Notes (Signed)
[facility] CONSULT NOTE  Patient Care Team: Pa, Murray as PCP - General (Internal Medicine) Mauro Kaufmann, RN as Oncology Nurse Navigator Rockwell Germany, RN as Oncology Nurse Navigator  CHIEF COMPLAINTS/PURPOSE OF CONSULTATION:  Newly diagnosed breast cancer  HISTORY OF PRESENTING ILLNESS:  Amanda Smith 56 y.o. female is here because of recent diagnosis of left breast cancer  I reviewed her records extensively and collaborated the history with the patient.  SUMMARY OF ONCOLOGIC HISTORY: Oncology History  Malignant neoplasm of upper-outer quadrant of left breast in female, estrogen receptor negative ([place])  02/12/2021 Initial Diagnosis   palpable solid mass in the left upper outer quadrant. Diagnostic mammogram on 02/12/2021 showed a 3 cm mass in the left upper outer quadrant. Biopsy on 02/20/2021 showed grade 3 invasive ductal carcinoma, ER/PR/Her2-([facility])   03/07/2021 Cancer Staging   Staging form: Breast, AJCC 8th Edition - Clinical stage from 03/07/2021: Stage IIB (cT2, cN0, cM0, G3, ER-, PR-, HER2-) - Signed by Nicholas Lose, MD on 03/07/2021 Stage prefix: Initial diagnosis Histologic grading system: 3 grade system    03/19/2021 -  Chemotherapy   Patient is on Treatment Plan : BREAST Pembrolizumab (200) D1 + Carboplatin (1.5) D1,8,15 + Paclitaxel (80) D1,8,15 q21d X 4 cycles / Pembrolizumab (200) D1 + AC D1 q21d x 4 cycles      [place] left axilla normal with no adenopathy identified. Family history significant for breast cancer in sister who died at the age of 102 with metastatic breast cancer.  Since last visit, she has met with Dr. Donne Hazel and agrees to port placement today as well as neoadjuvant chemotherapy.  She continues to feel well, she is in no pain.  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   She understands the side effects of chemotherapy and has agreed to proceed with. Rest of the pertinent  10 point ROS reviewed and negative.  MEDICAL HISTORY:  Past Medical History:  Diagnosis Date   Breast cancer ([place])    No PMH noted.  SURGICAL HISTORY:  She had C sections, no other major surgeries.  SOCIAL HISTORY: Social History   Socioeconomic History   Marital status: Married    Spouse name: Not on file   Number of children: Not on file   Years of education: Not on file   Highest education level: Not on file  Occupational History   Not on file  Tobacco Use   Smoking status: Never   Smokeless tobacco: Never  Substance and Sexual Activity   Alcohol use: Never   Drug use: Never   Sexual activity: Not on file  Other Topics Concern   Not on file  Social History Narrative   Not on file   Social Determinants of Health   Financial Resource Strain: Not on file  Food Insecurity: Not on file  Transportation Needs: Not on file  Physical Activity: Not on file  Stress: Not on file  Social Connections: Not on file  Intimate Partner Violence: Not on file    FAMILY HISTORY: Sister had breast cancer, died at 72 from breast cancer NO FH of ovarian cancer, female breast cancer, metastatic prostate cancer.  ALLERGIES:  has No Known Allergies.  MEDICATIONS:  Current Outpatient Medications  Medication Sig Dispense Refill   dexamethasone (DECADRON) 4 MG tablet Take 2 tablets (8 mg total) by mouth daily. Start the day after chemotherapy for 2 days. 30 tablet 1  lidocaine-prilocaine (EMLA) cream Apply to affected area once 30 g 3   ondansetron (ZOFRAN) 8 MG tablet Take 1 tablet (8 mg total) by mouth 2 (two) times daily as needed for refractory nausea / vomiting. Start on day 3 after chemo. 30 tablet 1   prochlorperazine (COMPAZINE) 10 MG tablet Take 1 tablet (10 mg total) by mouth every 6 (six) hours as needed (Nausea or vomiting). 30 tablet 1   VITAMIN D PO Take by mouth daily.     No current facility-administered medications for this visit.    REVIEW OF SYSTEMS:    Constitutional: Denies fevers, chills or abnormal night sweats Eyes: Denies blurriness of vision, double vision or watery eyes Ears, nose, mouth, throat, and face: Denies mucositis or sore throat Respiratory: Denies cough, dyspnea or wheezes Cardiovascular: Denies palpitation, chest discomfort or lower extremity swelling Gastrointestinal:  Denies nausea, heartburn or change in bowel habits Skin: Denies abnormal skin rashes Lymphatics: Denies new lymphadenopathy or easy bruising Neurological:Denies numbness, tingling or new weaknesses Behavioral/Psych: Mood is stable, no new changes  Breast:  palpated left breast lump 2 months ago All other systems were reviewed with the patient and are negative.  PHYSICAL EXAMINATION: ECOG PERFORMANCE STATUS: 0 - Asymptomatic  Vitals:   03/18/21 0848  BP: 110/62  Pulse: 84  Resp: 16  Temp: 97.9 F (36.6 C)  SpO2: 100%    Filed Weights   03/18/21 0848  Weight: 115 lb 12.8 oz (52.5 kg)     Physical exam deferred today but previous exam showed left breast upper outer lump measuring about 3 to 4 cm, likely nonpathologic palpable left axillary lymph nodes.  LABORATORY DATA:  I have reviewed the data as listed Lab Results  Component Value Date   WBC 5.7 11/23/2006   HGB 12.6 11/23/2006   HCT 37.0 11/23/2006   MCV 81.1 11/23/2006   PLT 251 11/23/2006   Lab Results  Component Value Date   NA 139 11/23/2006   K 4.2 11/23/2006   CL 107 11/23/2006    RADIOGRAPHIC STUDIES: I have personally reviewed the radiological reports and agreed with the findings in the report.  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  1. Neoadjuvant chemotherapy with  Taxol weekly 12 with carboplatin and Keytruda every 3  weeks x4, followed by Adriamycin and Cytoxan with Keytruda every 3 weeks followed by Beryle Flock maintenance for 1 year ( Adjuvant 9 cycles) 2. Followed by breast conserving surgery with sentinel lymph node study 3. Followed by adjuvant radiation therapy No role for antiestrogen therapy since this is a triple negative breast cancer   We have once again discussed the above-mentioned regimen.  I have discussed ago and about adverse effects of chemotherapy including but not limited to fatigue, nausea, vomiting, diarrhea, increased risk of infections, neuropathy, cardiotoxicity.  I have once again discussed about adverse effects of immunotherapy, most common symptoms being fatigue, thyroiditis, hepatitis but she understands that in 5 to 10% of the patients, inflammation can happen in virtually any part of your body and can be life-threatening/fatal. She will need a baseline echocardiogram in preparation for Adriamycin. Chemo teach will have to be scheduled. Okay to start as soon as possible.  She understands that she will need labs, MD visits/NP visits and chemotherapy appointments.  All questions were  answered. The patient knows to call the clinic with any problems, questions or concerns. Total time spent: 30 minutes   Benay Pike, MD 03/18/21

## 2021-03-18 NOTE — Interval H&P Note (Signed)
History and Physical Interval Note:  03/18/2021 12:30 PM  Amanda Smith  has presented today for surgery, with the diagnosis of BREAST CANCER.  The various methods of treatment have been discussed with the patient and family. After consideration of risks, benefits and other options for treatment, the patient has consented to  Procedure(s): INSERTION PORT-A-CATH (N/A) as a surgical intervention.  The patient's history has been reviewed, patient examined, no change in status, stable for surgery.  I have reviewed the patient's chart and labs.  Questions were answered to the patient's satisfaction.     Rolm Bookbinder

## 2021-03-18 NOTE — H&P (Signed)
°  This is an otherwise healthy 56 year old female. She noted a left breast mass back in December. This is gotten bigger since then. She has a family history significant for breast cancer Sister died at around age 24. She underwent evaluation with mammography and ultrasound. She has a 3 cm mass in the left upper outer quadrant on mammogram as well as ultrasound. Ultrasound of the left axilla is normal without any adenopathy identified. A core biopsy was then obtained of the mass. 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 She has been seen by medical oncology. She has an MRI scheduled as well and has plans to begin chemotherapy.  Review of Systems: A complete review of systems was obtained from the patient. I have reviewed this information and discussed as appropriate with the patient. See HPI as well for other ROS.  Review of Systems  All other systems reviewed and are negative.  Medical History: Past Medical History:  Diagnosis Date   Anemia   Patient Active Problem List  Diagnosis   Malignant neoplasm of upper-outer quadrant of left breast in female, estrogen receptor negative (CMS-HCC)   Past Surgical History:  Procedure Laterality Date   CESAREAN SECTION  x2   No Known Allergies  No current outpatient medications on file prior to visit.   Family History  Problem Relation Age of Onset   Stroke Sister    Social History   Tobacco Use  Smoking Status Never  Smokeless Tobacco Never    Social History   Socioeconomic History   Marital status: Single  Tobacco Use   Smoking status: Never   Smokeless tobacco: Never  Substance and Sexual Activity   Alcohol use: Not Currently   Drug use: Not Currently   Objective:   Vitals:  03/14/21 0850  BP: 110/70  Pulse: 104  Weight: 54.1 kg (119 lb 3.2 oz)    Physical Exam Vitals reviewed.  Constitutional:  Appearance: Normal appearance.  Chest:  Breasts: Right: No  inverted nipple, mass or nipple discharge.  Left: Mass present. No inverted nipple or nipple discharge.  Comments: 3 cm somewhat mobile left uoq mass with tented skin Lymphadenopathy:  Upper Body:  Right upper body: No supraclavicular or axillary adenopathy.  Left upper body: No supraclavicular or axillary adenopathy.  Neurological:  Mental Status: She is alert.     Assessment and Plan:   Malignant neoplasm of upper-outer quadrant of left breast in female, estrogen receptor negative (CMS-HCC)  Port placement  We discussed the staging and pathophysiology of breast cancer. We discussed all available treatment options. We did do genetic testing here today. I discussed the implications with life insurance. We discussed health insurance should not be affected. We discussed results including positive, negative, variant of uncertain significance and what all of those meant. I also discussed with her I think with a 3 cm triple negative cancer that primary chemotherapy is the first plan. We discussed the rationale for this. I did discuss a right IJ port placement with her and I will schedule this as soon as possible. We discussed I would see her at the end of chemotherapy and we would take all the information as well as her response to chemotherapy and then decide what surgery undergo. We briefly discussed lumpectomy versus mastectomy and sentinel node biopsy today.

## 2021-03-18 NOTE — Telephone Encounter (Signed)
.  Called patient to schedule appointment per 2/21 inbasket, patient is aware of date and time.   °

## 2021-03-18 NOTE — Anesthesia Preprocedure Evaluation (Signed)
Anesthesia Evaluation  Patient identified by MRN, date of birth, ID band Patient awake    Reviewed: Allergy & Precautions, NPO status , Patient's Chart, lab work & pertinent test results  Airway Mallampati: II  TM Distance: >3 FB Neck ROM: Full    Dental no notable dental hx.    Pulmonary neg pulmonary ROS,    Pulmonary exam normal breath sounds clear to auscultation       Cardiovascular negative cardio ROS Normal cardiovascular exam Rhythm:Regular Rate:Normal     Neuro/Psych negative neurological ROS  negative psych ROS   GI/Hepatic negative GI ROS, Neg liver ROS,   Endo/Other  negative endocrine ROS  Renal/GU negative Renal ROS  negative genitourinary   Musculoskeletal negative musculoskeletal ROS (+)   Abdominal   Peds negative pediatric ROS (+)  Hematology negative hematology ROS (+)   Anesthesia Other Findings Breast Cancer  Reproductive/Obstetrics negative OB ROS                             Anesthesia Physical Anesthesia Plan  ASA: 3  Anesthesia Plan: General   Post-op Pain Management:    Induction: Intravenous  PONV Risk Score and Plan: 3 and Ondansetron, Dexamethasone, Midazolam and Treatment may vary due to age or medical condition  Airway Management Planned: LMA  Additional Equipment:   Intra-op Plan:   Post-operative Plan: Extubation in OR  Informed Consent: I have reviewed the patients History and Physical, chart, labs and discussed the procedure including the risks, benefits and alternatives for the proposed anesthesia with the patient or authorized representative who has indicated his/her understanding and acceptance.     Dental advisory given  Plan Discussed with: CRNA  Anesthesia Plan Comments:         Anesthesia Quick Evaluation

## 2021-03-18 NOTE — Anesthesia Postprocedure Evaluation (Signed)
Anesthesia Post Note  Patient: Cheryln Smejkal  Procedure(s) Performed: INSERTION PORT-A-CATH (Right: Chest)     Patient location during evaluation: PACU Anesthesia Type: General Level of consciousness: awake and alert Pain management: pain level controlled Vital Signs Assessment: post-procedure vital signs reviewed and stable Respiratory status: spontaneous breathing, nonlabored ventilation and respiratory function stable Cardiovascular status: blood pressure returned to baseline and stable Postop Assessment: no apparent nausea or vomiting Anesthetic complications: no   No notable events documented.  Last Vitals:  Vitals:   03/18/21 1500 03/18/21 1515  BP: 105/74 106/73  Pulse: 82 70  Resp: 17 19  Temp:    SpO2: 98% 100%    Last Pain:  Vitals:   03/18/21 1500  TempSrc:   PainSc: 0-No pain                 Lynda Rainwater

## 2021-03-18 NOTE — Op Note (Signed)
Preoperative diagnosis: clinical stage II TN left breast cancer Postoperative diagnosis: Same as above Procedure: Right internal jugular port placement Surgeon: Dr. Serita Grammes Anesthesia: General Estimated blood loss: Minimal Specimens: None Drains: None Complications: None Special count was correct completion Disposition recovery stable edition  Indications: This is an otherwise healthy 56 year old female. She noted a left breast mass back in December. This is gotten bigger since then. She has a family history significant for breast cancer Sister died at around age 54. She underwent evaluation with mammography and ultrasound. She has a 3 cm mass in the left upper outer quadrant on mammogram as well as ultrasound. Ultrasound of the left axilla is normal without any adenopathy identified. A core biopsy was then obtained of the mass. 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 She has been seen by medical oncology. She has an MRI scheduled as well and has plans to begin chemotherapy.  Procedure: After informed consent was obtained she was taken to the operating.  She was given antibiotics.  SCDs were in place.  She was placed under general anesthesia without complication.  She was prepped and draped in a standard sterile surgical fashion.  Surgical timeout was then performed.  I then identified her right internal jugular vein with the ultrasound.  I made a small nick in the skin.  I then accessed this on the first pass with the needle.  The wire was placed. This was confirmed to be in the vein by the ultrasound as well as by fluoroscopy.  I then placed local into the area below her clavicle where the pocket was made.  I made incision and developed a pocket.  I then tunneled the line between the 2 sites.  The dilator was then placed over the wire.  This was done using fluoroscopy.  The wire was then removed.  The line was placed through the sheath  and the sheath removed.  I pulled the line back to be in the distal cava near the cavoatrial junction.  This was a good position upon completion.  I then hooked the port up to the line.  I sutured this position with 2-0 Prolene suture.  I accessed the port, flushed easily and I placed heparin in the port.  I then got a final completion film that showed this all to be in good position.  I then closed with 3-0 Vicryl and 4-0 Monocryl.  Glue was placed.  She tolerated this well and was transferred to recovery.

## 2021-03-18 NOTE — Discharge Instructions (Addendum)
PORT-A-CATH: POST OP INSTRUCTIONS  Always review your discharge instruction sheet given to you by the facility where your surgery was performed.   A prescription for pain medication may be given to you upon discharge. Take your pain medication as prescribed, if needed. If narcotic pain medicine is not needed, then you make take acetaminophen (Tylenol) or ibuprofen (Advil) as needed.  Take your usually prescribed medications unless otherwise directed. If you need a refill on your pain medication, please contact our office. All narcotic pain medicine now requires a paper prescription.  Phoned in and fax refills are no longer allowed by law.  Prescriptions will not be filled after 5 pm or on weekends.  You should follow a light diet for the remainder of the day after your procedure. Most patients will experience some mild swelling and/or bruising in the area of the incision. It may take several days to resolve. It is common to experience some constipation if taking pain medication after surgery. Increasing fluid intake and taking a stool softener (such as Colace) will usually help or prevent this problem from occurring. A mild laxative (Milk of Magnesia or Miralax) should be taken according to package directions if there are no bowel movements after 48 hours.  Unless discharge instructions indicate otherwise, you may remove your bandages 48 hours after surgery, and you may shower at that time. You may have steri-strips (small white skin tapes) in place directly over the incision.  These strips should be left on the skin for 7-10 days.  If your surgeon used Dermabond (skin glue) on the incision, you may shower in 24 hours.  The glue will flake off over the next 2-3 weeks.  If your port is left accessed at the end of surgery (needle left in port), the dressing cannot get wet and should only by changed by a healthcare professional. When the port is no longer accessed (when the needle has been removed),  follow step 7.   ACTIVITIES:  Limit activity involving your arms for the next 72 hours. Do no strenuous exercise or activity for 1 week. You may drive when you are no longer taking prescription pain medication, you can comfortably wear a seatbelt, and you can maneuver your car. 10.You may need to see your doctor in the office for a follow-up appointment.  Please       check with your doctor.  11.When you receive a new Port-a-Cath, you will get a product guide and        ID card.  Please keep them in case you need them.  WHEN TO CALL YOUR DOCTOR [phone]): Fever over 101.0 Chills Continued bleeding from incision Increased redness and tenderness at the site Shortness of breath, difficulty breathing   The clinic staff is available to answer your questions during regular business hours. Please dont hesitate to call and ask to speak to one of the nurses or medical assistants for clinical concerns. If you have a medical emergency, go to the nearest emergency room or call 911.  A surgeon from [facility] is always on call at the hospital.     For further information, please visit [email or web address]    No tylenol until after 7:00pm tonight, if needed.   Post Anesthesia Home Care Instructions  Activity: Get plenty of rest for the remainder of the day. A responsible individual must stay with you for 24 hours following the procedure.  For the next 24 hours, DO NOT: -Drive a car Film/video editor -Drink  alcoholic beverages -Take any medication unless instructed by your physician -Make any legal decisions or sign important papers.  Meals: Start with liquid foods such as gelatin or soup. Progress to regular foods as tolerated. Avoid greasy, spicy, heavy foods. If nausea and/or vomiting occur, drink only clear liquids until the nausea and/or vomiting subsides. Call your physician if vomiting continues.  Special Instructions/Symptoms: Your throat may feel dry  or sore from the anesthesia or the breathing tube placed in your throat during surgery. If this causes discomfort, gargle with warm salt water. The discomfort should disappear within 24 hours.  If you had a scopolamine patch placed behind your ear for the management of post- operative nausea and/or vomiting:  1. The medication in the patch is effective for 72 hours, after which it should be removed.  Wrap patch in a tissue and discard in the trash. Wash hands thoroughly with soap and water. 2. You may remove the patch earlier than 72 hours if you experience unpleasant side effects which may include dry mouth, dizziness or visual disturbances. 3. Avoid touching the patch. Wash your hands with soap and water after contact with the patch.

## 2021-03-18 NOTE — Anesthesia Procedure Notes (Signed)
Procedure Name: LMA Insertion Date/Time: 03/18/2021 1:51 PM Performed by: Maryella Shivers, CRNA Pre-anesthesia Checklist: Patient identified, Emergency Drugs available, Suction available and Patient being monitored Patient Re-evaluated:Patient Re-evaluated prior to induction Oxygen Delivery Method: Circle system utilized Preoxygenation: Pre-oxygenation with 100% oxygen Induction Type: IV induction Ventilation: Mask ventilation without difficulty LMA: LMA inserted LMA Size: 4.0 Number of attempts: 1 Airway Equipment and Method: Bite block Placement Confirmation: positive ETCO2 Tube secured with: Tape Dental Injury: Teeth and Oropharynx as per pre-operative assessment

## 2021-03-18 NOTE — Transfer of Care (Signed)
Immediate Anesthesia Transfer of Care Note  Patient: Amanda Smith  Procedure(s) Performed: INSERTION PORT-A-CATH (Right: Chest)  Patient Location: PACU  Anesthesia Type:General  Level of Consciousness: sedated  Airway & Oxygen Therapy: Patient Spontanous Breathing and Patient connected to face mask oxygen  Post-op Assessment: Report given to RN and Post -op Vital signs reviewed and stable  Post vital signs: Reviewed and stable  Last Vitals:  Vitals Value Taken Time  BP 106/61 03/18/21 1434  Temp    Pulse 81 03/18/21 1435  Resp 14 03/18/21 1435  SpO2 100 % 03/18/21 1435  Vitals shown include unvalidated device data.  Last Pain:  Vitals:   03/18/21 1248  TempSrc: Oral  PainSc: 0-No pain      Patients Stated Pain Goal: 0 (37/48/27 0786)  Complications: No notable events documented.

## 2021-03-19 ENCOUNTER — Encounter: Payer: Self-pay | Admitting: Hematology and Oncology

## 2021-03-19 ENCOUNTER — Other Ambulatory Visit: Payer: Self-pay | Admitting: *Deleted

## 2021-03-19 ENCOUNTER — Telehealth: Payer: Self-pay | Admitting: Hematology and Oncology

## 2021-03-19 ENCOUNTER — Encounter (HOSPITAL_BASED_OUTPATIENT_CLINIC_OR_DEPARTMENT_OTHER): Payer: Self-pay | Admitting: General Surgery

## 2021-03-19 NOTE — Telephone Encounter (Signed)
Contacted patient to discuss changes to her appointments that are made. Patient is aware. She will come in and get a printed calender as well when she comes in.   Please note patient is confused about a lot of upcoming appointments and changes that she requested herself.  She is also is stating that she was not made aware of any appointments that are scheduled on her schedule for [facility].    Patient is very confused. I will transfer her to the nurse for further evaluation.

## 2021-03-20 ENCOUNTER — Encounter: Payer: Self-pay | Admitting: *Deleted

## 2021-03-20 ENCOUNTER — Encounter: Payer: Self-pay | Admitting: Hematology and Oncology

## 2021-03-20 DIAGNOSIS — C50412 Malignant neoplasm of upper-outer quadrant of left female breast: Secondary | ICD-10-CM

## 2021-03-20 NOTE — Progress Notes (Signed)
Location of Breast Cancer:  Malignant neoplasm of upper-outer quadrant of left breast in female, estrogen receptor negative   Histology per Pathology Report:  02/19/2021 A. BREAST, LEFT UPPER OUTER QUADRANT, NEEDLE CORE BIOPSY:  -  Invasive ductal carcinoma  -  See comment  COMMENT:  The biopsy shows an invasive carcinoma which is positive for cytokeratin 7 and focally positive for GATA-3.  ER, PR and HER2 are negative.  In the absence of other lesions, this is favored to represent a breast primary.  Based on the biopsy, the carcinoma appears Nottingham grade 3 of 3 and measures 0.7 cm in greatest linear extent  Receptor Status: ER(Negative), PR (Negative), Her2-neu (Negative)  Did patient present with symptoms (if so, please note symptoms) or was this found on screening mammography?: Patient felt a palpable solid mass in the left upper outer quadrant. Diagnostic mammogram on 02/12/2021 showed a 3 cm mass in the left upper outer quadrant.  Past/Anticipated interventions by surgeon, if any:  Plan for lumpectomy with sentinel node biopsy after completing neoadjuvant therapy  Past/Anticipated interventions by medical oncology, if any:  Under care of Dr. Arletha Pili Iruku 03/18/2021 Neoadjuvant chemotherapy with  Taxol weekly 12 with carboplatin and Keytruda every 3 weeks x4, followed by Adriamycin and Cytoxan with Keytruda every 3 weeks followed by Beryle Flock maintenance for 1 year ( Adjuvant 9 cycles) She will need a baseline echocardiogram in preparation for Adriamycin Followed by breast conserving surgery with sentinel lymph node study Followed by adjuvant radiation therapy No role for antiestrogen therapy since this is a triple negative breast cancer  Lymphedema issues, if any:  Patient denies    Pain issues, if any:  Patient denies   SAFETY ISSUES: Prior radiation? No Pacemaker/ICD? No Possible current pregnancy? No--postmenopausal  Is the patient on methotrexate? No  Current  Complaints / other details:  Nothing else of note

## 2021-03-20 NOTE — Progress Notes (Signed)
Radiation Oncology         [phone]) [phone] ________________________________  Initial Outpatient Consultation  Name: Amanda Smith MRN: [id number]  Date: 03/21/2021  DOB: 11-07-1965  CC:[facility]  Nicholas Lose, MD   REFERRING PHYSICIAN: Nicholas Lose, MD  DIAGNOSIS:    ICD-10-CM   1. Malignant neoplasm of upper-outer quadrant of left breast in female, estrogen receptor negative ([place])  C50.412 Ambulatory referral to Social Work   Z17.1        Cancer Staging  Malignant neoplasm of upper-outer quadrant of left breast in female, estrogen receptor negative ([place]) Staging form: Breast, AJCC 8th Edition - Clinical stage from 03/07/2021: Stage IIB (cT2, cN0, cM0, G3, ER-, PR-, HER2-) - Signed by Nicholas Lose, MD on 03/07/2021 Stage prefix: Initial diagnosis Histologic grading system: 3 grade system  Left Breast UOQ Invasive Ductal Carcinoma, ER- / PR- / Her2-, Grade 3  CHIEF COMPLAINT: Here to discuss management of left breast cancer  HISTORY OF PRESENT ILLNESS::Amanda Smith is a 56 y.o. female who presented with a palpable mass in the upper outer left breast. Subsequent diagnostic bilateral mammogram and left breast ultrasound on 02/12/21 ([facility]) revealed a 3 cm mass in the upper outer left breast, 6 cmfn, correlating with the area of palpable concern.  No abnormal lymph nodes were identified.   Left breast UOQ biopsy on the date of 02/19/21 showed grade 3 invasive ductal carcinoma measuring at least 0.7 cm.  ER status: negative; PR status negative, Her2 status negative; Grade 3. No lymph nodes were examined.   Subsequently, the patient was referred to Dr. Chryl Heck on 03/07/21 to discuss treatment options. During this visit, the patient reported presence of the palpable lump since this past December. She also reported that the lump progressively increased in size since that time. Otherwise, the patient was noted to be generally healthy. Following discussion of treatment  options, the patient agreed to proceed with neoadjuvant chemotherapy with Taxol weekly 12, carboplatin and Keytruda every 3 weeks x4, followed by Adriamycin and Cytoxan with Keytruda every 3 weeks, and Keytruda maintenance for 1 year (total of 9 cycles). They patient underwent port-a-cath placement on 03/18/21.  The patient has met with Dr. Donne Hazel and will return back to him after chemotherapy to discuss the need for surgery.   Of note: The patient has a family history significant for breast cancer in her sister who died at the age of 60 with metastatic breast cancer. Genetic testing was performed during her visit with Dr. Donne Hazel on 03/14/21. Results are pending at this time.   MRI breasts - pending.  PREVIOUS RADIATION THERAPY: No  PAST MEDICAL HISTORY:  has a past medical history of Breast cancer ([place]).    PAST SURGICAL HISTORY: Past Surgical History:  Procedure Laterality Date   CESAREAN SECTION     PORTACATH PLACEMENT Right 03/18/2021   Procedure: INSERTION PORT-A-CATH;  Surgeon: Rolm Bookbinder, MD;  Location: [place] [facility];  Service: General;  Laterality: Right;   WRIST FRACTURE SURGERY      FAMILY HISTORY: family history includes Breast cancer in her sister.  SOCIAL HISTORY:  reports that she has never smoked. She has never used smokeless tobacco. She reports that she does not drink alcohol and does not use drugs.  ALLERGIES: Patient has no known allergies.  MEDICATIONS:  Current Outpatient Medications  Medication Sig Dispense Refill   dexamethasone (DECADRON) 4 MG tablet Take 2 tablets (8 mg total) by mouth daily. Start the day after chemotherapy for  2 days. 30 tablet 1   lidocaine-prilocaine (EMLA) cream Apply to affected area once 30 g 3   ondansetron (ZOFRAN) 8 MG tablet Take 1 tablet (8 mg total) by mouth 2 (two) times daily as needed for refractory nausea / vomiting. Start on day 3 after chemo. 30 tablet 1   prochlorperazine (COMPAZINE) 10 MG tablet  Take 1 tablet (10 mg total) by mouth every 6 (six) hours as needed (Nausea or vomiting). 30 tablet 1   traMADol (ULTRAM) 50 MG tablet Take 2 tablets (100 mg total) by mouth every 6 (six) hours as needed. 6 tablet 0   VITAMIN D PO Take by mouth daily.     No current facility-administered medications for this encounter.    REVIEW OF SYSTEMS: As above in HPI.   PHYSICAL EXAM:  height is $RemoveB'5\' 2"'vfUDiaiC$  (1.575 m) and weight is 115 lb 4 oz (52.3 kg). Her temporal temperature is 96.9 F (36.1 C) (abnormal). Her blood pressure is 105/68 and her pulse is 80. Her respiration is 18 and oxygen saturation is 100%.   General: Alert and oriented, in no acute distress HEENT: Head is normocephalic.   Heart: Regular in rate and rhythm with no murmurs, rubs, or gallops. Chest: Clear to auscultation bilaterally, with no rhonchi, wheezes, or rales. Extremities: No cyanosis or edema. Skin: No concerning lesions. Musculoskeletal: symmetric strength and muscle tone throughout. Neurologic: Cranial nerves II through XII are grossly intact. No obvious focalities. Speech is fluent. Coordination is intact. Psychiatric: Judgment and insight are intact. Affect is appropriate. Breasts: 4cm mass, UOQ, left breast . No other palpable masses appreciated in the breasts or axillae b/l .   ECOG = 1  0 - Asymptomatic (Fully active, able to carry on all predisease activities without restriction)  1 - Symptomatic but completely ambulatory (Restricted in physically strenuous activity but ambulatory and able to carry out work of a light or sedentary nature. For example, light housework, office work)  2 - Symptomatic, <50% in bed during the day (Ambulatory and capable of all self care but unable to carry out any work activities. Up and about more than 50% of waking hours)  3 - Symptomatic, >50% in bed, but not bedbound (Capable of only limited self-care, confined to bed or chair 50% or more of waking hours)  4 - Bedbound (Completely  disabled. Cannot carry on any self-care. Totally confined to bed or chair)  5 - Death   Eustace Pen MM, Creech RH, Tormey DC, et al. [phone]). "Toxicity and response criteria of the [facility]". [place] Oncol. 5 (6): 649-55   LABORATORY DATA:  Lab Results  Component Value Date   WBC 5.7 11/23/2006   HGB 12.6 11/23/2006   HCT 37.0 11/23/2006   MCV 81.1 11/23/2006   PLT 251 11/23/2006   CMP     Component Value Date/Time   NA 139 11/23/2006 0335   K 4.2 11/23/2006 0335   CL 107 11/23/2006 0335   GLUCOSE 97 11/23/2006 0335   BUN 11 11/23/2006 0335   CREATININE 0.8 11/23/2006 0335         RADIOGRAPHY: DG C-Arm 1-60 Min  Result Date: 03/19/2021 CLINICAL DATA:  Fluoroscopy for insertion of Port-A-Cath. EXAM: DG C-ARM 1-60 MIN FLUOROSCOPY: Fluoroscopy Time:  16.3 seconds Radiation Exposure Index (if provided by the fluoroscopic device): 1.5 mGy Number of Acquired Spot Images: 1 COMPARISON:  None. FINDINGS: Single fluoroscopic spot view obtained demonstrating an internal jugular Port-A-Cath with tip in the lower  SVC. IMPRESSION: Procedural fluoroscopy for Port-A-Cath placement. Electronically Signed   By: Keith Rake M.D.   On: 03/19/2021 15:15      IMPRESSION/PLAN: Today, I talked to the patient about the findings and work-up thus far. She is of deep faith and we talked about this in the context of her diagnosis.   We discussed the patient's diagnosis of left breast cancer and general treatment for this, highlighting the role of radiotherapy in the management.  We discussed the available radiation techniques, and focused on the details of logistics and delivery.     The patient was encouraged to ask questions that I answered to the best of my ability.    I would recommend post lumpectomy-RT if she is able to have breast conservation. I would also consider post mastectomy RT as well if she does not have a complete response to chemotherapy.  We discussed these  scenarios in detail. MRI breasts and genetic results are pending.  We discussed that radiation would take approximately 4-6 weeks to complete and that I would give the patient a few weeks to heal following surgery before starting treatment planning. We spoke about acute effects including skin irritation and fatigue as well as much less common late effects including internal organ injury or irritation. We spoke about the latest technology that is used to minimize the risk of late effects for patients undergoing radiotherapy to the breast or chest wall. No guarantees of treatment were given.   I look forward to participating in the patient's care.  I will await her referral back to me for postoperative follow-up and eventual CT simulation/treatment planning.   Referral to social work for additional emotional support.  On date of service, in total, I spent 65 minutes on this encounter. Patient was seen in person.   __________________________________________   Eppie Gibson, MD  This document serves as a record of services personally performed by Eppie Gibson, MD. It was created on her behalf by Roney Mans, a trained medical scribe. The creation of this record is based on the scribe's personal observations and the provider's statements to them. This document has been checked and approved by the attending provider.

## 2021-03-21 ENCOUNTER — Other Ambulatory Visit: Payer: Self-pay

## 2021-03-21 ENCOUNTER — Ambulatory Visit (HOSPITAL_COMMUNITY)
Admission: RE | Admit: 2021-03-21 | Discharge: 2021-03-21 | Disposition: A | Discharge status: Home / Self Care | Payer: PRIVATE HEALTH INSURANCE | Source: Ambulatory Visit | Attending: Hematology and Oncology | Admitting: Hematology and Oncology

## 2021-03-21 ENCOUNTER — Ambulatory Visit
Admission: RE | Admit: 2021-03-21 | Discharge: 2021-03-21 | Disposition: A | Discharge status: Home / Self Care | Payer: Self-pay | Source: Ambulatory Visit | Attending: Radiation Oncology | Admitting: Radiation Oncology

## 2021-03-21 ENCOUNTER — Encounter: Payer: Self-pay | Admitting: Radiation Oncology

## 2021-03-21 ENCOUNTER — Ambulatory Visit
Admission: RE | Admit: 2021-03-21 | Discharge: 2021-03-21 | Disposition: A | Discharge status: Home / Self Care | Payer: BC Managed Care – PPO | Source: Ambulatory Visit | Attending: Radiation Oncology | Admitting: Radiation Oncology

## 2021-03-21 VITALS — BP 105/68 | HR 80 | Temp 96.9°F | Resp 18 | Ht 62.0 in | Wt 115.2 lb

## 2021-03-21 DIAGNOSIS — Z79899 Other long term (current) drug therapy: Secondary | ICD-10-CM | POA: Insufficient documentation

## 2021-03-21 DIAGNOSIS — Z171 Estrogen receptor negative status [ER-]: Secondary | ICD-10-CM | POA: Insufficient documentation

## 2021-03-21 DIAGNOSIS — Z803 Family history of malignant neoplasm of breast: Secondary | ICD-10-CM | POA: Insufficient documentation

## 2021-03-21 DIAGNOSIS — C50412 Malignant neoplasm of upper-outer quadrant of left female breast: Secondary | ICD-10-CM

## 2021-03-21 DIAGNOSIS — Z0189 Encounter for other specified special examinations: Secondary | ICD-10-CM | POA: Diagnosis not present

## 2021-03-21 LAB — ECHOCARDIOGRAM COMPLETE
AR max vel: 1.42 cm2
AV Area VTI: 1.27 cm2
AV Area mean vel: 1.28 cm2
AV Mean grad: 3 mmHg
AV Peak grad: 4.8 mmHg
Ao pk vel: 1.1 m/s
Area-P 1/2: 3.93 cm2
Calc EF: 60.9 %
Height: 62 in
S' Lateral: 2.6 cm
Single Plane A2C EF: 56.8 %
Single Plane A4C EF: 66.1 %
Weight: 1844 oz

## 2021-03-21 NOTE — Progress Notes (Signed)
Pharmacist Chemotherapy Monitoring - Initial Assessment   ? ?Anticipated start date: 03/28/21  ? ?The following has been reviewed per standard work regarding the patient's treatment regimen: ?The patient's diagnosis, treatment plan and drug doses, and organ/hematologic function ?Lab orders and baseline tests specific to treatment regimen  ?The treatment plan start date, drug sequencing, and pre-medications ?Prior authorization status  ?Patient's documented medication list, including drug-drug interaction screen and prescriptions for anti-emetics and supportive care specific to the treatment regimen ?The drug concentrations, fluid compatibility, administration routes, and timing of the medications to be used ?The patient's access for treatment and lifetime cumulative dose history, if applicable  ?The patient's medication allergies and previous infusion related reactions, if applicable  ? ?Changes made to treatment plan:  ?treatment plan date ? ?Follow up needed:  ?Pending authorization for treatment  ? ? ?Kennith Center, Pharm.D., CPP ?03/21/2021@3 :11 PM ? ? ? ?

## 2021-03-21 NOTE — Progress Notes (Signed)
° °  Echocardiogram 2D Echocardiogram has been performed.  Beryle Beams 03/21/2021, 10:40 AM

## 2021-03-24 ENCOUNTER — Telehealth: Payer: Self-pay | Admitting: Licensed Clinical Social Worker

## 2021-03-24 ENCOUNTER — Other Ambulatory Visit (HOSPITAL_COMMUNITY): Payer: Self-pay | Admitting: Diagnostic Radiology

## 2021-03-24 ENCOUNTER — Encounter: Payer: Self-pay | Admitting: *Deleted

## 2021-03-24 ENCOUNTER — Other Ambulatory Visit: Payer: BC Managed Care – PPO

## 2021-03-24 NOTE — Telephone Encounter (Signed)
Amanda Smith  Clinical Social Smith was referred by  radiation oncology  for assessment of psychosocial needs for patient with new diagnosis breast cancer.  Clinical Social Worker  attempted to contact pt by phone   to offer support and assess for needs.    No answer. Left VM with direct contact information.     [place], [place] Worker [employer]

## 2021-03-25 ENCOUNTER — Inpatient Hospital Stay: Payer: PRIVATE HEALTH INSURANCE

## 2021-03-25 ENCOUNTER — Other Ambulatory Visit: Payer: Self-pay

## 2021-03-25 ENCOUNTER — Other Ambulatory Visit (HOSPITAL_COMMUNITY): Payer: BC Managed Care – PPO

## 2021-03-26 ENCOUNTER — Encounter: Payer: Self-pay | Admitting: Licensed Clinical Social Worker

## 2021-03-26 NOTE — Progress Notes (Signed)
[place] ?Clinical Social Work ? ?Clinical Social Work was referred by medical provider for assessment of psychosocial needs.  Clinical Social Worker contacted patient by phone  to offer support and assess for needs.   ? ?Patient had difficulty voicing how she is feeling and coping initially but then expressed that she is relying on her spirituality and faith and knowing that this is part of God's plan.  She is trying not to invite negativity by going too much into potential bad outcomes or impacts from treatment and will know more about how to handle the physical aspects after her first treatment on Friday. ? ?Pt agreed to have CSW check-in during her first infusion. CSW will bring information on support programs. ? ? ? ?Jahmiyah Dullea E Quanetta Truss, LCSW  ?Clinical Social Worker ?[place] ?      ? ?

## 2021-03-27 ENCOUNTER — Other Ambulatory Visit: Payer: Self-pay | Admitting: Hematology and Oncology

## 2021-03-28 ENCOUNTER — Other Ambulatory Visit: Payer: Self-pay | Admitting: Hematology and Oncology

## 2021-03-28 ENCOUNTER — Inpatient Hospital Stay: Payer: PRIVATE HEALTH INSURANCE | Admitting: Licensed Clinical Social Worker

## 2021-03-28 ENCOUNTER — Other Ambulatory Visit: Payer: Self-pay

## 2021-03-28 ENCOUNTER — Inpatient Hospital Stay: Payer: PRIVATE HEALTH INSURANCE

## 2021-03-28 ENCOUNTER — Inpatient Hospital Stay: Payer: PRIVATE HEALTH INSURANCE | Attending: Hematology and Oncology

## 2021-03-28 ENCOUNTER — Other Ambulatory Visit: Payer: BC Managed Care – PPO

## 2021-03-28 ENCOUNTER — Encounter: Payer: Self-pay | Admitting: *Deleted

## 2021-03-28 VITALS — BP 100/71 | HR 78 | Temp 98.4°F | Resp 18 | Wt 114.5 lb

## 2021-03-28 DIAGNOSIS — Z171 Estrogen receptor negative status [ER-]: Secondary | ICD-10-CM | POA: Diagnosis not present

## 2021-03-28 DIAGNOSIS — C50412 Malignant neoplasm of upper-outer quadrant of left female breast: Secondary | ICD-10-CM

## 2021-03-28 DIAGNOSIS — Z5111 Encounter for antineoplastic chemotherapy: Secondary | ICD-10-CM | POA: Diagnosis not present

## 2021-03-28 DIAGNOSIS — Z95828 Presence of other vascular implants and grafts: Secondary | ICD-10-CM

## 2021-03-28 LAB — CMP (CANCER CENTER ONLY)
ALT: 22 U/L (ref 0–44)
AST: 29 U/L (ref 15–41)
Albumin: 4 g/dL (ref 3.5–5.0)
Alkaline Phosphatase: 87 U/L (ref 38–126)
Anion gap: 5 (ref 5–15)
BUN: 23 mg/dL — ABNORMAL HIGH (ref 6–20)
CO2: 31 mmol/L (ref 22–32)
Calcium: 9.9 mg/dL (ref 8.9–10.3)
Chloride: 104 mmol/L (ref 98–111)
Creatinine: 0.83 mg/dL (ref 0.44–1.00)
GFR, Estimated: >60 mL/min (ref >60)
Glucose, Bld: 132 mg/dL — ABNORMAL HIGH (ref 70–99)
Potassium: 3.8 mmol/L (ref 3.5–5.1)
Sodium: 140 mmol/L (ref 135–145)
Total Bilirubin: 0.7 mg/dL (ref 0.3–1.2)
Total Protein: 7.8 g/dL (ref 6.5–8.1)

## 2021-03-28 LAB — CBC WITH DIFFERENTIAL (CANCER CENTER ONLY)
Abs Immature Granulocytes: 0.01 10*3/uL (ref 0.00–0.07)
Basophils Absolute: 0 10*3/uL (ref 0.0–0.1)
Basophils Relative: 1 %
Eosinophils Absolute: 0.3 10*3/uL (ref 0.0–0.5)
Eosinophils Relative: 8 %
HCT: 36.2 % (ref 36.0–46.0)
Hemoglobin: 11.6 g/dL — ABNORMAL LOW (ref 12.0–15.0)
Immature Granulocytes: 0 %
Lymphocytes Relative: 36 %
Lymphs Abs: 1.5 10*3/uL (ref 0.7–4.0)
MCH: 27.6 pg (ref 26.0–34.0)
MCHC: 32 g/dL (ref 30.0–36.0)
MCV: 86.2 fL (ref 80.0–100.0)
Monocytes Absolute: 0.3 10*3/uL (ref 0.1–1.0)
Monocytes Relative: 7 %
Neutro Abs: 2 10*3/uL (ref 1.7–7.7)
Neutrophils Relative %: 48 %
Platelet Count: 189 10*3/uL (ref 150–400)
RBC: 4.2 MIL/uL (ref 3.87–5.11)
RDW: 12.8 % (ref 11.5–15.5)
WBC Count: 4.1 10*3/uL (ref 4.0–10.5)
nRBC: 0 % (ref 0.0–0.2)

## 2021-03-28 LAB — TSH: TSH: 1.587 u[IU]/mL (ref 0.308–3.960)

## 2021-03-28 MED ORDER — SODIUM CHLORIDE 0.9 % IV SOLN
10.0000 mg | Freq: Once | INTRAVENOUS | Status: AC
  Administered 2021-03-28: 10 mg via INTRAVENOUS
  Filled 2021-03-28: qty 10

## 2021-03-28 MED ORDER — DIPHENHYDRAMINE HCL 50 MG/ML IJ SOLN
50.0000 mg | Freq: Once | INTRAMUSCULAR | Status: AC
  Administered 2021-03-28: 50 mg via INTRAVENOUS
  Filled 2021-03-28: qty 1

## 2021-03-28 MED ORDER — HEPARIN SOD (PORK) LOCK FLUSH 100 UNIT/ML IV SOLN
500.0000 [IU] | Freq: Once | INTRAVENOUS | Status: AC | PRN
  Administered 2021-03-28: 500 [IU]

## 2021-03-28 MED ORDER — SODIUM CHLORIDE 0.9 % IV SOLN
200.0000 mg | Freq: Once | INTRAVENOUS | Status: AC
  Administered 2021-03-28: 200 mg via INTRAVENOUS
  Filled 2021-03-28: qty 8

## 2021-03-28 MED ORDER — SODIUM CHLORIDE 0.9% FLUSH
10.0000 mL | INTRAVENOUS | Status: DC | PRN
  Administered 2021-03-28: 10 mL via INTRAVENOUS

## 2021-03-28 MED ORDER — FAMOTIDINE IN NACL 20-0.9 MG/50ML-% IV SOLN
20.0000 mg | Freq: Once | INTRAVENOUS | Status: AC
  Administered 2021-03-28: 20 mg via INTRAVENOUS
  Filled 2021-03-28: qty 50

## 2021-03-28 MED ORDER — SODIUM CHLORIDE 0.9% FLUSH
10.0000 mL | INTRAVENOUS | Status: DC | PRN
  Administered 2021-03-28: 10 mL

## 2021-03-28 MED ORDER — PALONOSETRON HCL INJECTION 0.25 MG/5ML
0.2500 mg | Freq: Once | INTRAVENOUS | Status: AC
  Administered 2021-03-28: 0.25 mg via INTRAVENOUS
  Filled 2021-03-28: qty 5

## 2021-03-28 MED ORDER — SODIUM CHLORIDE 0.9 % IV SOLN
131.5500 mg | Freq: Once | INTRAVENOUS | Status: AC
  Administered 2021-03-28: 130 mg via INTRAVENOUS
  Filled 2021-03-28: qty 13

## 2021-03-28 MED ORDER — SODIUM CHLORIDE 0.9 % IV SOLN: Freq: Once | INTRAVENOUS | Status: AC

## 2021-03-28 MED ORDER — SODIUM CHLORIDE 0.9 % IV SOLN
80.0000 mg/m2 | Freq: Once | INTRAVENOUS | Status: AC
  Administered 2021-03-28: 126 mg via INTRAVENOUS
  Filled 2021-03-28: qty 21

## 2021-03-28 NOTE — Progress Notes (Signed)
Hypersensitivity Reaction note ? ?Date of event: 03/28/21 ?Time of event: 1326 ?Generic name of drug involved: benadryl (Diphenhydramine) ?Name of provider notified of the hypersensitivity reaction: Iruku ?Was agent that likely caused hypersensitivity reaction added to Allergies List within EMR? Yes  ?Chain of events including reaction signs/symptoms, treatment administered, and outcome (e.g., drug resumed; drug discontinued; sent to Emergency Department; etc.) patient received benadryl IV 50mg , medication was given slow at 273ml/hr for 46ml. Patient started falling asleep and became non verbal. Patient was confused. Patient still was able to receive her chemo therapy for the first time. Provider notified that medication may be to much. Provider will adjust accordingly.  ? ?Tildon Husky, RN ?03/28/2021 3:28 PM ? ?

## 2021-03-28 NOTE — Patient Instructions (Signed)
[facility]  Discharge Instructions: ?Thank you for choosing [facility] to provide your oncology and hematology care.  ? ?If you have a lab appointment with the [facility], please go directly to the [facility] and check in at the registration area. ?  ?Wear comfortable clothing and clothing appropriate for easy access to any Portacath or PICC line.  ? ?We strive to give you quality time with your provider. You may need to reschedule your appointment if you arrive late (15 or more minutes).  Arriving late affects you and other patients whose appointments are after yours.  Also, if you miss three or more appointments without notifying the office, you may be dismissed from the clinic at the provider?s discretion.    ?  ?For prescription refill requests, have your pharmacy contact our office and allow 72 hours for refills to be completed.   ? ?Today you received the following chemotherapy and/or immunotherapy agents keytruda, taxol, carboplatin     ?  ?To help prevent nausea and vomiting after your treatment, we encourage you to take your nausea medication as directed. ? ?BELOW ARE SYMPTOMS THAT SHOULD BE REPORTED IMMEDIATELY: ?*FEVER GREATER THAN 100.4 F (38 ?C) OR HIGHER ?*CHILLS OR SWEATING ?*NAUSEA AND VOMITING THAT IS NOT CONTROLLED WITH YOUR NAUSEA MEDICATION ?*UNUSUAL SHORTNESS OF BREATH ?*UNUSUAL BRUISING OR BLEEDING ?*URINARY PROBLEMS (pain or burning when urinating, or frequent urination) ?*BOWEL PROBLEMS (unusual diarrhea, constipation, pain near the anus) ?TENDERNESS IN MOUTH AND THROAT WITH OR WITHOUT PRESENCE OF ULCERS (sore throat, sores in mouth, or a toothache) ?UNUSUAL RASH, SWELLING OR PAIN  ?UNUSUAL VAGINAL DISCHARGE OR ITCHING  ? ?Items with * indicate a potential emergency and should be followed up as soon as possible or go to the Emergency Department if any problems should occur. ? ?Please show the CHEMOTHERAPY ALERT CARD or IMMUNOTHERAPY ALERT  CARD at check-in to the Emergency Department and triage nurse. ? ?Should you have questions after your visit or need to cancel or reschedule your appointment, please contact [facility]  Dept: [phone]  and follow the prompts.  Office hours are 8:00 a.m. to 4:30 p.m. Monday - Friday. Please note that voicemails left after 4:00 p.m. may not be returned until the following business day.  We are closed weekends and major holidays. You have access to a nurse at all times for urgent questions. Please call the main number to the clinic Dept: [phone] and follow the prompts. ? ? ?For any non-urgent questions, you may also contact your provider using MyChart. We now offer e-Visits for anyone 49 and older to request care online for non-urgent symptoms. For details visit [email or web address]. ?  ?Also download the MyChart app! Go to the app store, search "MyChart", open the app, select , and log in with your MyChart username and password. ? ?Due to Covid, a mask is required upon entering the hospital/clinic. If you do not have a mask, one will be given to you upon arrival. For doctor visits, patients may have 1 support person aged 69 or older with them. For treatment visits, patients cannot have anyone with them due to current Covid guidelines and our immunocompromised population.  ? ? ? ? ?

## 2021-03-28 NOTE — Progress Notes (Signed)
[facility] CSW Progress Note ? ?Clinical Social Worker attempted to see patient in infusion to follow-up after phone call earlier this week.  Patient sedated from benadryl and unable to engage today. CSW left information on support programs for nurse to give to pt at end of treatment. ? ? ? ?Christeen Douglas LCSW ?

## 2021-03-29 ENCOUNTER — Ambulatory Visit (HOSPITAL_COMMUNITY)
Admission: RE | Admit: 2021-03-29 | Discharge: 2021-03-29 | Disposition: A | Discharge status: Home / Self Care | Payer: PRIVATE HEALTH INSURANCE | Source: Ambulatory Visit | Attending: Hematology and Oncology | Admitting: Hematology and Oncology

## 2021-03-29 DIAGNOSIS — C50412 Malignant neoplasm of upper-outer quadrant of left female breast: Secondary | ICD-10-CM | POA: Diagnosis present

## 2021-03-29 DIAGNOSIS — Z171 Estrogen receptor negative status [ER-]: Secondary | ICD-10-CM | POA: Diagnosis present

## 2021-03-29 LAB — T4: T4, Total: 7.5 ug/dL (ref 4.5–12.0)

## 2021-03-29 MED ORDER — GADOBUTROL 1 MMOL/ML IV SOLN
5.0000 mL | Freq: Once | INTRAVENOUS | Status: AC | PRN
  Administered 2021-03-29: 5 mL via INTRAVENOUS

## 2021-03-31 ENCOUNTER — Telehealth: Payer: Self-pay | Admitting: *Deleted

## 2021-03-31 ENCOUNTER — Encounter: Payer: Self-pay | Admitting: *Deleted

## 2021-03-31 NOTE — Telephone Encounter (Signed)
-----   Message from Tildon Husky, RN sent at 03/28/2021  5:43 PM EST ----- ?Regarding: first time treatment call back - Iru[facility] ?Patient received treatment today for the first time. She is seen by Dr Chryl Heck. She received [place], [place], and taxol. She tolerated treatment well but the benadryl was a little too much. Gave her 50 iv benadryl over 5 minutes and it made her very sedated for nearly her whole treatment. Doctor is aware, hypersensitivity placed.  ? ?

## 2021-03-31 NOTE — Telephone Encounter (Signed)
Called pt to see how she did with her recent treatment.  Left VM to call back ?

## 2021-04-01 ENCOUNTER — Telehealth: Payer: Self-pay | Admitting: *Deleted

## 2021-04-01 NOTE — Telephone Encounter (Signed)
This RN spoke with pt who was returning call left yesterday by Edu- inquiring about how she is doing - she was in a teaching session at the time and could not take the call. ? ?Amanda Smith states overall she has felt well " and really cannot say I have any side effects " ? 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 ? ?Questions answered about appts this week -pt is seeing [facility]/NP prior to next therapy- with no further needs at this time. ? ?This note will be sent to [facility]/NP of note- pt prefers use to not use the word " drug or drugs" for therapy- but words like medication, therapy, or treatment . ? ? ?

## 2021-04-03 ENCOUNTER — Encounter: Payer: Self-pay | Admitting: Hematology and Oncology

## 2021-04-03 ENCOUNTER — Encounter: Payer: Self-pay | Admitting: Adult Health

## 2021-04-03 ENCOUNTER — Inpatient Hospital Stay: Payer: PRIVATE HEALTH INSURANCE

## 2021-04-03 ENCOUNTER — Encounter: Payer: Self-pay | Admitting: *Deleted

## 2021-04-03 ENCOUNTER — Inpatient Hospital Stay (HOSPITAL_BASED_OUTPATIENT_CLINIC_OR_DEPARTMENT_OTHER): Payer: PRIVATE HEALTH INSURANCE | Admitting: Adult Health

## 2021-04-03 ENCOUNTER — Other Ambulatory Visit: Payer: Self-pay

## 2021-04-03 VITALS — BP 98/64 | HR 79 | Temp 97.7°F | Resp 16 | Wt 113.7 lb

## 2021-04-03 DIAGNOSIS — Z171 Estrogen receptor negative status [ER-]: Secondary | ICD-10-CM | POA: Diagnosis not present

## 2021-04-03 DIAGNOSIS — E559 Vitamin D deficiency, unspecified: Secondary | ICD-10-CM

## 2021-04-03 DIAGNOSIS — Z95828 Presence of other vascular implants and grafts: Secondary | ICD-10-CM

## 2021-04-03 DIAGNOSIS — C50412 Malignant neoplasm of upper-outer quadrant of left female breast: Secondary | ICD-10-CM

## 2021-04-03 DIAGNOSIS — R5383 Other fatigue: Secondary | ICD-10-CM | POA: Diagnosis not present

## 2021-04-03 LAB — CMP (CANCER CENTER ONLY)
ALT: 27 U/L (ref 0–44)
AST: 29 U/L (ref 15–41)
Albumin: 3.9 g/dL (ref 3.5–5.0)
Alkaline Phosphatase: 78 U/L (ref 38–126)
Anion gap: 5 (ref 5–15)
BUN: 19 mg/dL (ref 6–20)
CO2: 29 mmol/L (ref 22–32)
Calcium: 9.6 mg/dL (ref 8.9–10.3)
Chloride: 105 mmol/L (ref 98–111)
Creatinine: 0.76 mg/dL (ref 0.44–1.00)
GFR, Estimated: >60 mL/min (ref >60)
Glucose, Bld: 100 mg/dL — ABNORMAL HIGH (ref 70–99)
Potassium: 4 mmol/L (ref 3.5–5.1)
Sodium: 139 mmol/L (ref 135–145)
Total Bilirubin: 0.9 mg/dL (ref 0.3–1.2)
Total Protein: 7.5 g/dL (ref 6.5–8.1)

## 2021-04-03 LAB — CBC WITH DIFFERENTIAL (CANCER CENTER ONLY)
Abs Immature Granulocytes: 0 10*3/uL (ref 0.00–0.07)
Basophils Absolute: 0 10*3/uL (ref 0.0–0.1)
Basophils Relative: 1 %
Eosinophils Absolute: 0.2 10*3/uL (ref 0.0–0.5)
Eosinophils Relative: 6 %
HCT: 34.9 % — ABNORMAL LOW (ref 36.0–46.0)
Hemoglobin: 11.2 g/dL — ABNORMAL LOW (ref 12.0–15.0)
Immature Granulocytes: 0 %
Lymphocytes Relative: 37 %
Lymphs Abs: 1.3 10*3/uL (ref 0.7–4.0)
MCH: 27.3 pg (ref 26.0–34.0)
MCHC: 32.1 g/dL (ref 30.0–36.0)
MCV: 85.1 fL (ref 80.0–100.0)
Monocytes Absolute: 0.2 10*3/uL (ref 0.1–1.0)
Monocytes Relative: 4 %
Neutro Abs: 1.9 10*3/uL (ref 1.7–7.7)
Neutrophils Relative %: 52 %
Platelet Count: 183 10*3/uL (ref 150–400)
RBC: 4.1 MIL/uL (ref 3.87–5.11)
RDW: 12.6 % (ref 11.5–15.5)
WBC Count: 3.6 10*3/uL — ABNORMAL LOW (ref 4.0–10.5)
nRBC: 0 % (ref 0.0–0.2)

## 2021-04-03 LAB — TSH: TSH: 1.741 u[IU]/mL (ref 0.308–3.960)

## 2021-04-03 MED ORDER — HEPARIN SOD (PORK) LOCK FLUSH 100 UNIT/ML IV SOLN
500.0000 [IU] | Freq: Once | INTRAVENOUS | Status: AC
  Administered 2021-04-03: 08:00:00 500 [IU] via INTRAVENOUS

## 2021-04-03 MED ORDER — SODIUM CHLORIDE 0.9% FLUSH
10.0000 mL | INTRAVENOUS | Status: DC | PRN
  Administered 2021-04-03: 08:00:00 10 mL via INTRAVENOUS

## 2021-04-03 MED FILL — Dexamethasone Sodium Phosphate Inj 100 MG/10ML: INTRAMUSCULAR | Qty: 1 | Status: AC

## 2021-04-03 NOTE — Progress Notes (Signed)
Called pt to introduce myself as her Financial Resource Specialist, discuss copay assistance and the [employer] grant.  I left a msg requesting she return my call if she's interested in applying for the grants. 

## 2021-04-03 NOTE — Assessment & Plan Note (Addendum)
Amanda Smith is a 48 year oldIris is a 56 year old woman with 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 ? 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 ? ? ?Treatment Plan:  ?1. Neoadjuvant chemotherapy with pembrolizumab, carboplatin, Taxol followed by pembrolizumab, Adriamycin, Cytoxan. ?2. Followed by breast conserving surgery with sentinel lymph node study ?3. Followed by adjuvant radiation therapy ?No role for antiestrogen therapy since this is a triple negative breast cancer ? ?______________________________________________________ ?Current treatment: Pembrolizumab, Taxol, carbo. ? ?Amanda Smith is doing well today.  Her labs thus far are stable.  She will proceed with her next cycle of chemotherapy tomorrow.  She feels like her breast tumor has increased somewhat in size.  We will keep a close eye on this and we will see her with each chemotherapy cycle as if her mass does grow we may need to obtain ultrasound and/or change her treatment to the Adriamycin and Cytoxan.  Sometimes it does take a couple of cycles to get the chemotherapy in her system to work. ? ?

## 2021-04-03 NOTE — Progress Notes (Signed)
[facility]:    Pa, [facility] [employer], [address] [place] [address]   DIAGNOSIS:  Cancer Staging  Malignant neoplasm of upper-outer quadrant of left breast in female, estrogen receptor negative ([place]) Staging form: Breast, AJCC 8th Edition - Clinical stage from 03/07/2021: Stage IIB (cT2, cN0, cM0, G3, ER-, PR-, HER2-) - Signed by Nicholas Lose, MD on 03/07/2021 Stage prefix: Initial diagnosis Histologic grading system: 3 grade system   SUMMARY OF ONCOLOGIC HISTORY: Oncology History  Malignant neoplasm of upper-outer quadrant of left breast in female, estrogen receptor negative ([place])  02/12/2021 Initial Diagnosis   palpable solid mass in the left upper outer quadrant. Diagnostic mammogram on 02/12/2021 showed a 3 cm mass in the left upper outer quadrant. Biopsy on 02/20/2021 showed grade 3 invasive ductal carcinoma, ER/PR/Her2-([facility])   03/07/2021 Cancer Staging   Staging form: Breast, AJCC 8th Edition - Clinical stage from 03/07/2021: Stage IIB (cT2, cN0, cM0, G3, ER-, PR-, HER2-) - Signed by Nicholas Lose, MD on 03/07/2021 Stage prefix: Initial diagnosis Histologic grading system: 3 grade system    03/28/2021 -  Chemotherapy   Patient is on Treatment Plan : BREAST Pembrolizumab (200) D1 + Carboplatin (1.5) D1,8,15 + Paclitaxel (80) D1,8,15 q21d X 4 cycles / Pembrolizumab (200) D1 + AC D1 q21d x 4 cycles       CURRENT THERAPY: Taxol, Carbo, Pembrolizumab  INTERVAL HISTORY: Amanda Smith 56 y.o. female returns for evaluation prior to receiving her next cycle of neoadjuvant chemotherapy.  Her most recent echocardiogram was completed on March 21, 2021 and showed a normal left ventricular ejection fraction of 60 to 65%.  Amanda Smith tells me that she is doing well.  She notes that she tolerated her first round of chemotherapy with Taxol, carbo, pembrolizumab quite well.  He is concerned because she feels like her left breast  mass has increased somewhat in size.  Otherwise she is doing well today.   Patient Active Problem List   Diagnosis Date Noted   Malignant neoplasm of upper-outer quadrant of left breast in female, estrogen receptor negative ([place]) 03/06/2021    is allergic to benadryl [diphenhydramine].  MEDICAL HISTORY: Past Medical History:  Diagnosis Date   Breast cancer ([place])     SURGICAL HISTORY: Past Surgical History:  Procedure Laterality Date   CESAREAN SECTION     PORTACATH PLACEMENT Right 03/18/2021   Procedure: INSERTION PORT-A-CATH;  Surgeon: Rolm Bookbinder, MD;  Location: [facility];  Service: General;  Laterality: Right;   WRIST FRACTURE SURGERY      SOCIAL HISTORY: Social History   Socioeconomic History   Marital status: Married    Spouse name: Not on file   Number of children: Not on file   Years of education: Not on file   Highest education level: Not on file  Occupational History   Not on file  Tobacco Use   Smoking status: Never   Smokeless tobacco: Never  Vaping Use   Vaping Use: Never used  Substance and Sexual Activity   Alcohol use: Never   Drug use: Never   Sexual activity: Yes    Birth control/protection: Post-menopausal  Other Topics Concern   Not on file  Social History Narrative   Not on file   Social Determinants of Health   Financial Resource Strain: Not on file  Food Insecurity: Not on file  Transportation Needs: Not on file  Physical Activity: Not on file  Stress: Not on  file  Social Connections: Not on file  Intimate Partner Violence: Not on file    FAMILY HISTORY: Family History  Problem Relation Age of Onset   Breast cancer Sister     Review of Systems  Constitutional:  Negative for appetite change, chills, fatigue, fever and unexpected weight change.  HENT:   Negative for hearing loss, lump/mass and trouble swallowing.   Eyes:  Negative for eye problems and icterus.  Respiratory:  Negative for chest tightness,  cough and shortness of breath.   Cardiovascular:  Negative for chest pain, leg swelling and palpitations.  Gastrointestinal:  Negative for abdominal distention, abdominal pain, constipation, diarrhea, nausea and vomiting.  Endocrine: Negative for hot flashes.  Genitourinary:  Negative for difficulty urinating.   Musculoskeletal:  Negative for arthralgias.  Skin:  Negative for itching and rash.  Neurological:  Negative for dizziness, extremity weakness, headaches and numbness.  Hematological:  Negative for adenopathy. Does not bruise/bleed easily.  Psychiatric/Behavioral:  Negative for depression. The patient is not nervous/anxious.      PHYSICAL EXAMINATION  ECOG PERFORMANCE STATUS: 1 - Symptomatic but completely ambulatory  Vitals:   04/03/21 0838  BP: 98/64  Pulse: 79  Resp: 16  Temp: 97.7 F (36.5 C)  SpO2: 100%    Physical Exam Constitutional:      General: She is not in acute distress.    Appearance: Normal appearance. She is not toxic-appearing.  HENT:     Head: Normocephalic and atraumatic.  Eyes:     General: No scleral icterus. Cardiovascular:     Rate and Rhythm: Normal rate and regular rhythm.     Pulses: Normal pulses.     Heart sounds: Normal heart sounds.  Pulmonary:     Effort: Pulmonary effort is normal.     Breath sounds: Normal breath sounds.  Chest:     Comments: Left breast with a 4-5 cm hardened mass in the upper outer quadrant Abdominal:     General: Abdomen is flat. Bowel sounds are normal. There is no distension.     Palpations: Abdomen is soft.     Tenderness: There is no abdominal tenderness.  Musculoskeletal:        General: No swelling.     Cervical back: Neck supple.  Lymphadenopathy:     Cervical: No cervical adenopathy.  Skin:    General: Skin is warm and dry.     Findings: No rash.  Neurological:     General: No focal deficit present.     Mental Status: She is alert.  Psychiatric:        Mood and Affect: Mood normal.         Behavior: Behavior normal.    LABORATORY DATA:  CBC    Component Value Date/Time   WBC 3.6 (L) 04/03/2021 0812   WBC 5.7 11/23/2006 0325   RBC 4.10 04/03/2021 0812   HGB 11.2 (L) 04/03/2021 0812   HCT 34.9 (L) 04/03/2021 0812   PLT 183 04/03/2021 0812   MCV 85.1 04/03/2021 0812   MCH 27.3 04/03/2021 0812   MCHC 32.1 04/03/2021 0812   RDW 12.6 04/03/2021 0812   LYMPHSABS 1.3 04/03/2021 0812   MONOABS 0.2 04/03/2021 0812   EOSABS 0.2 04/03/2021 0812   BASOSABS 0.0 04/03/2021 0812    CMP     Component Value Date/Time   NA 139 04/03/2021 0812   K 4.0 04/03/2021 0812   CL 105 04/03/2021 0812   CO2 29 04/03/2021 0812   GLUCOSE 100 (H)  04/03/2021 0812   BUN 19 04/03/2021 0812   CREATININE 0.76 04/03/2021 0812   CALCIUM 9.6 04/03/2021 0812   PROT 7.5 04/03/2021 0812   ALBUMIN 3.9 04/03/2021 0812   AST 29 04/03/2021 0812   ALT 27 04/03/2021 0812   ALKPHOS 78 04/03/2021 0812   BILITOT 0.9 04/03/2021 0812   GFRNONAA >60 04/03/2021 0812      ASSESSMENT and THERAPY PLAN:   Malignant neoplasm of upper-outer quadrant of left breast in female, estrogen receptor negative ([place]) Amanda Smith is a 21 year oldIris is a 56 year old woman with 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  01/2021: Left upper outer quadrant biopsy shows grade 3, 3 cm invasive ductal carcinoma triple negative.   Treatment Plan:  1. Neoadjuvant chemotherapy with pembrolizumab, carboplatin, Taxol followed by pembrolizumab, Adriamycin, Cytoxan. 2. Followed by breast conserving surgery with sentinel lymph node study 3. Followed by adjuvant radiation therapy No role for antiestrogen therapy since this is a triple negative breast cancer  ______________________________________________________ Current treatment: Pembrolizumab, Taxol, carbo.  Hollynn is doing well today.  Her labs thus far are stable.  She will proceed with her next cycle  of chemotherapy tomorrow.  She feels like her breast tumor has increased somewhat in size.  We will keep a close eye on this and we will see her with each chemotherapy cycle as if her mass does grow we may need to obtain ultrasound and/or change her treatment to the Adriamycin and Cytoxan.  Sometimes it does take a couple of cycles to get the chemotherapy in her system to work.     All questions were answered. The patient knows to call the clinic with any problems, questions or concerns. We can certainly see the patient much sooner if necessary.  Total encounter time: 30 minutes in face-to-face visit time, chart review, lab review, care coordination, order entry, and documentation of the encounter.  Amanda Bihari, NP 04/03/21 9:41 AM Medical Oncology and Hematology [facility] [address] [place], [place] [address] Tel. [phone]    Fax. [phone]  *Total Encounter Time as defined by the Centers for Medicare and Medicaid Services includes, in addition to the face-to-face time of a patient visit (documented in the note above) non-face-to-face time: obtaining and reviewing outside history, ordering and reviewing medications, tests or procedures, care coordination (communications with other health care professionals or caregivers) and documentation in the medical record.

## 2021-04-04 ENCOUNTER — Ambulatory Visit: Payer: BC Managed Care – PPO | Admitting: Adult Health

## 2021-04-04 ENCOUNTER — Other Ambulatory Visit: Payer: BC Managed Care – PPO

## 2021-04-04 ENCOUNTER — Inpatient Hospital Stay: Payer: PRIVATE HEALTH INSURANCE

## 2021-04-04 ENCOUNTER — Ambulatory Visit: Payer: BC Managed Care – PPO

## 2021-04-04 VITALS — BP 105/67 | HR 84 | Temp 98.5°F | Resp 18

## 2021-04-04 DIAGNOSIS — Z171 Estrogen receptor negative status [ER-]: Secondary | ICD-10-CM

## 2021-04-04 DIAGNOSIS — C50412 Malignant neoplasm of upper-outer quadrant of left female breast: Secondary | ICD-10-CM

## 2021-04-04 LAB — T4: T4, Total: 6.4 ug/dL (ref 4.5–12.0)

## 2021-04-04 MED ORDER — SODIUM CHLORIDE 0.9 % IV SOLN: Freq: Once | INTRAVENOUS | Status: AC

## 2021-04-04 MED ORDER — CETIRIZINE HCL 10 MG/ML IV SOLN
10.0000 mg | Freq: Once | INTRAVENOUS | Status: AC
  Administered 2021-04-04: 10 mg via INTRAVENOUS
  Filled 2021-04-04: qty 1

## 2021-04-04 MED ORDER — SODIUM CHLORIDE 0.9% FLUSH
10.0000 mL | INTRAVENOUS | Status: DC | PRN
  Administered 2021-04-04: 10 mL

## 2021-04-04 MED ORDER — FAMOTIDINE IN NACL 20-0.9 MG/50ML-% IV SOLN
20.0000 mg | Freq: Once | INTRAVENOUS | Status: AC
  Administered 2021-04-04: 20 mg via INTRAVENOUS
  Filled 2021-04-04: qty 50

## 2021-04-04 MED ORDER — PALONOSETRON HCL INJECTION 0.25 MG/5ML
0.2500 mg | Freq: Once | INTRAVENOUS | Status: AC
  Administered 2021-04-04: 0.25 mg via INTRAVENOUS
  Filled 2021-04-04: qty 5

## 2021-04-04 MED ORDER — SODIUM CHLORIDE 0.9 % IV SOLN
135.0000 mg | Freq: Once | INTRAVENOUS | Status: AC
  Administered 2021-04-04: 140 mg via INTRAVENOUS
  Filled 2021-04-04: qty 14

## 2021-04-04 MED ORDER — SODIUM CHLORIDE 0.9 % IV SOLN
10.0000 mg | Freq: Once | INTRAVENOUS | Status: AC
  Administered 2021-04-04: 10 mg via INTRAVENOUS
  Filled 2021-04-04: qty 10

## 2021-04-04 MED ORDER — HEPARIN SOD (PORK) LOCK FLUSH 100 UNIT/ML IV SOLN
500.0000 [IU] | Freq: Once | INTRAVENOUS | Status: AC | PRN
  Administered 2021-04-04: 500 [IU]

## 2021-04-04 MED ORDER — SODIUM CHLORIDE 0.9 % IV SOLN
80.0000 mg/m2 | Freq: Once | INTRAVENOUS | Status: AC
  Administered 2021-04-04: 126 mg via INTRAVENOUS
  Filled 2021-04-04: qty 21

## 2021-04-04 NOTE — Patient Instructions (Signed)
[facility] MEDICAL ONCOLOGY  Discharge Instructions: Thank you for choosing [facility] to provide your oncology and hematology care.   If you have a lab appointment with the Cancer Center, please go directly to the Cancer Center and check in at the registration area.   Wear comfortable clothing and clothing appropriate for easy access to any Portacath or PICC line.   We strive to give you quality time with your provider. You may need to reschedule your appointment if you arrive late (15 or more minutes).  Arriving late affects you and other patients whose appointments are after yours.  Also, if you miss three or more appointments without notifying the office, you may be dismissed from the clinic at the provider's discretion.      For prescription refill requests, have your pharmacy contact our office and allow 72 hours for refills to be completed.    Today you received the following chemotherapy and/or immunotherapy agents: Paclitaxel (Taxol) and Carboplatin.   To help prevent nausea and vomiting after your treatment, we encourage you to take your nausea medication as directed.  BELOW ARE SYMPTOMS THAT SHOULD BE REPORTED IMMEDIATELY: *FEVER GREATER THAN 100.4 F (38 C) OR HIGHER *CHILLS OR SWEATING *NAUSEA AND VOMITING THAT IS NOT CONTROLLED WITH YOUR NAUSEA MEDICATION *UNUSUAL SHORTNESS OF BREATH *UNUSUAL BRUISING OR BLEEDING *URINARY PROBLEMS (pain or burning when urinating, or frequent urination) *BOWEL PROBLEMS (unusual diarrhea, constipation, pain near the anus) TENDERNESS IN MOUTH AND THROAT WITH OR WITHOUT PRESENCE OF ULCERS (sore throat, sores in mouth, or a toothache) UNUSUAL RASH, SWELLING OR PAIN  UNUSUAL VAGINAL DISCHARGE OR ITCHING   Items with * indicate a potential emergency and should be followed up as soon as possible or go to the Emergency Department if any problems should occur.  Please show the CHEMOTHERAPY ALERT CARD or IMMUNOTHERAPY  ALERT CARD at check-in to the Emergency Department and triage nurse.  Should you have questions after your visit or need to cancel or reschedule your appointment, please contact [facility] MEDICAL ONCOLOGY  Dept: [phone]  and follow the prompts.  Office hours are 8:00 a.m. to 4:30 p.m. Monday - Friday. Please note that voicemails left after 4:00 p.m. may not be returned until the following business day.  We are closed weekends and major holidays. You have access to a nurse at all times for urgent questions. Please call the main number to the clinic Dept: [phone] and follow the prompts.   For any non-urgent questions, you may also contact your provider using MyChart. We now offer e-Visits for anyone 18 and older to request care online for non-urgent symptoms. For details visit [email or web address].   Also download the MyChart app! Go to the app store, search "MyChart", open the app, select [facility], and log in with your MyChart username and password.  Due to Covid, a mask is required upon entering the hospital/clinic. If you do not have a mask, one will be given to you upon arrival. For doctor visits, patients may have 1 support person aged 18 or older with them. For treatment visits, patients cannot have anyone with them due to current Covid guidelines and our immunocompromised population.   

## 2021-04-05 ENCOUNTER — Ambulatory Visit: Payer: BC Managed Care – PPO

## 2021-04-07 ENCOUNTER — Ambulatory Visit: Payer: BC Managed Care – PPO

## 2021-04-08 ENCOUNTER — Encounter: Payer: Self-pay | Admitting: General Surgery

## 2021-04-08 ENCOUNTER — Ambulatory Visit: Payer: BC Managed Care – PPO

## 2021-04-10 ENCOUNTER — Other Ambulatory Visit: Payer: Self-pay

## 2021-04-10 ENCOUNTER — Inpatient Hospital Stay (HOSPITAL_BASED_OUTPATIENT_CLINIC_OR_DEPARTMENT_OTHER): Payer: PRIVATE HEALTH INSURANCE | Admitting: Hematology and Oncology

## 2021-04-10 ENCOUNTER — Inpatient Hospital Stay (HOSPITAL_BASED_OUTPATIENT_CLINIC_OR_DEPARTMENT_OTHER): Payer: PRIVATE HEALTH INSURANCE

## 2021-04-10 ENCOUNTER — Encounter: Payer: Self-pay | Admitting: Hematology and Oncology

## 2021-04-10 VITALS — BP 113/57 | HR 93 | Temp 97.7°F | Resp 16 | Ht 62.0 in | Wt 113.9 lb

## 2021-04-10 DIAGNOSIS — Z171 Estrogen receptor negative status [ER-]: Secondary | ICD-10-CM

## 2021-04-10 DIAGNOSIS — R634 Abnormal weight loss: Secondary | ICD-10-CM | POA: Diagnosis not present

## 2021-04-10 DIAGNOSIS — C50412 Malignant neoplasm of upper-outer quadrant of left female breast: Secondary | ICD-10-CM | POA: Diagnosis not present

## 2021-04-10 LAB — CBC WITH DIFFERENTIAL (CANCER CENTER ONLY)
Abs Immature Granulocytes: 0.01 10*3/uL (ref 0.00–0.07)
Basophils Absolute: 0 10*3/uL (ref 0.0–0.1)
Basophils Relative: 1 %
Eosinophils Absolute: 0.1 10*3/uL (ref 0.0–0.5)
Eosinophils Relative: 4 %
HCT: 32.6 % — ABNORMAL LOW (ref 36.0–46.0)
Hemoglobin: 10.4 g/dL — ABNORMAL LOW (ref 12.0–15.0)
Immature Granulocytes: 0 %
Lymphocytes Relative: 36 %
Lymphs Abs: 1 10*3/uL (ref 0.7–4.0)
MCH: 27.5 pg (ref 26.0–34.0)
MCHC: 31.9 g/dL (ref 30.0–36.0)
MCV: 86.2 fL (ref 80.0–100.0)
Monocytes Absolute: 0.1 10*3/uL (ref 0.1–1.0)
Monocytes Relative: 4 %
Neutro Abs: 1.4 10*3/uL — ABNORMAL LOW (ref 1.7–7.7)
Neutrophils Relative %: 55 %
Platelet Count: 184 10*3/uL (ref 150–400)
RBC: 3.78 MIL/uL — ABNORMAL LOW (ref 3.87–5.11)
RDW: 12.7 % (ref 11.5–15.5)
WBC Count: 2.6 10*3/uL — ABNORMAL LOW (ref 4.0–10.5)
nRBC: 0 % (ref 0.0–0.2)

## 2021-04-10 LAB — CMP (CANCER CENTER ONLY)
ALT: 33 U/L (ref 0–44)
AST: 32 U/L (ref 15–41)
Albumin: 3.8 g/dL (ref 3.5–5.0)
Alkaline Phosphatase: 73 U/L (ref 38–126)
Anion gap: 6 (ref 5–15)
BUN: 27 mg/dL — ABNORMAL HIGH (ref 6–20)
CO2: 29 mmol/L (ref 22–32)
Calcium: 9.6 mg/dL (ref 8.9–10.3)
Chloride: 104 mmol/L (ref 98–111)
Creatinine: 0.76 mg/dL (ref 0.44–1.00)
GFR, Estimated: >60 mL/min (ref >60)
Glucose, Bld: 149 mg/dL — ABNORMAL HIGH (ref 70–99)
Potassium: 3.5 mmol/L (ref 3.5–5.1)
Sodium: 139 mmol/L (ref 135–145)
Total Bilirubin: 1.1 mg/dL (ref 0.3–1.2)
Total Protein: 7.6 g/dL (ref 6.5–8.1)

## 2021-04-10 LAB — TSH: TSH: 2.355 u[IU]/mL (ref 0.308–3.960)

## 2021-04-10 LAB — VITAMIN B12: Vitamin B-12: 394 pg/mL (ref 180–914)

## 2021-04-10 MED ORDER — HEPARIN SOD (PORK) LOCK FLUSH 100 UNIT/ML IV SOLN
500.0000 [IU] | INTRAVENOUS | Status: AC | PRN
  Administered 2021-04-10: 500 [IU]

## 2021-04-10 MED ORDER — SODIUM CHLORIDE 0.9% FLUSH
10.0000 mL | INTRAVENOUS | Status: AC | PRN
  Administered 2021-04-10: 10 mL

## 2021-04-10 NOTE — Assessment & Plan Note (Signed)
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 ? 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 ? ? ?Treatment Plan:  ?1. Neoadjuvant chemotherapy with pembrolizumab, carboplatin, Taxol followed by pembrolizumab, Adriamycin, Cytoxan. ?2. Followed by breast conserving surgery with sentinel lymph node study ?3. Followed by adjuvant radiation therapy ?No role for antiestrogen therapy since this is a triple negative breast cancer ?4. Adjuvant immunotherapy for 9 cycles. ? ?She is here before her planned cycle of chemotherapy ? ?

## 2021-04-10 NOTE — Progress Notes (Signed)
[facility] Follow up: ?  ? ?Pa, [facility] ?[address], [address] ?[place] [address] ? ? ?DIAGNOSIS:  Cancer Staging  ?Malignant neoplasm of upper-outer quadrant of left breast in female, estrogen receptor negative ([facility]) ?Staging form: Breast, AJCC 8th Edition ?- Clinical stage from 03/07/2021: Stage IIB (cT2, cN0, cM0, G3, ER-, PR-, HER2-) - Signed by Gudena, Vinay, MD on 03/07/2021 ?Stage prefix: Initial diagnosis ?Histologic grading system: 3 grade system ? ? ?SUMMARY OF ONCOLOGIC HISTORY: ?Oncology History  ?Malignant neoplasm of upper-outer quadrant of left breast in female, estrogen receptor negative ([facility])  ?02/12/2021 Initial Diagnosis  ? palpable solid mass in the left upper outer quadrant. Diagnostic mammogram on 02/12/2021 showed a 3 cm mass in the left upper outer quadrant. Biopsy on 02/20/2021 showed grade 3 invasive ductal carcinoma, ER/PR/Her2-([facility]) ?  ?03/07/2021 Cancer Staging  ? Staging form: Breast, AJCC 8th Edition ?- Clinical stage from 03/07/2021: Stage IIB (cT2, cN0, cM0, G3, ER-, PR-, HER2-) - Signed by Gudena, Vinay, MD on 03/07/2021 ?Stage prefix: Initial diagnosis ?Histologic grading system: 3 grade system ? ?  ?03/28/2021 -  Chemotherapy  ? Patient is on Treatment Plan : BREAST Pembrolizumab (200) D1 + Carboplatin (1.5) D1,8,15 + Paclitaxel (80) D1,8,15 q21d X 4 cycles / Pembrolizumab (200) D1 + AC D1 q21d x 4 cycles  ?   ? ? ?CURRENT THERAPY: Taxol, Carbo, Pembrolizumab ? ?INTERVAL HISTORY: ? ?Kodi Holbein 56 y.o. female returns for evaluation prior to receiving her next cycle of neoadjuvant chemotherapy.  Baseline ECHO March 21, 2021 and showed a normal left ventricular ejection fraction of 60 to 65%. ? ?She had last chemotherapy on 3/10, felt a bit nauseous that day. No vomiting. She did notice that her bowels became loose for one episode. No tingling or numbness in her hands or feet. ?No fevers or chills.  ?She is not sure if the  tumor is enlarging. ? ?Patient Active Problem List  ? Diagnosis Date Noted  ? Malignant neoplasm of upper-outer quadrant of left breast in female, estrogen receptor negative ([facility]) 03/06/2021  ? ? ?is allergic to benadryl [diphenhydramine]. ? ?MEDICAL HISTORY: ?Past Medical History:  ?Diagnosis Date  ? Breast cancer ([facility])   ? ? ?SURGICAL HISTORY: ?Past Surgical History:  ?Procedure Laterality Date  ? CESAREAN SECTION    ? PORTACATH PLACEMENT Right 03/18/2021  ? Procedure: INSERTION PORT-A-CATH;  Surgeon: Wakefield, Matthew, MD;  Location: [facility];  Service: General;  Laterality: Right;  ? WRIST FRACTURE SURGERY    ? ? ?SOCIAL HISTORY: ?Social History  ? ?Socioeconomic History  ? Marital status: Married  ?  Spouse name: Not on file  ? Number of children: Not on file  ? Years of education: Not on file  ? Highest education level: Not on file  ?Occupational History  ? Not on file  ?Tobacco Use  ? Smoking status: Never  ? Smokeless tobacco: Never  ?Vaping Use  ? Vaping Use: Never used  ?Substance and Sexual Activity  ? Alcohol use: Never  ? Drug use: Never  ? Sexual activity: Yes  ?  Birth control/protection: Post-menopausal  ?Other Topics Concern  ? Not on file  ?Social History Narrative  ? Not on file  ? ?Social Determinants of Health  ? ?Financial Resource Strain: Not on file  ?Food Insecurity: Not on file  ?Transportation Needs: Not on file  ?Physical Activity: Not on file  ?Stress: Not on file  ?Social Connections: Not on   file  ?Intimate Partner Violence: Not on file  ? ? ?FAMILY HISTORY: ?Family History  ?Problem Relation Age of Onset  ? Breast cancer Sister   ? ? ?Review of Systems  ?Constitutional:  Negative for appetite change, chills, fatigue, fever and unexpected weight change.  ?HENT:   Negative for hearing loss, lump/mass and trouble swallowing.   ?Eyes:  Negative for eye problems and icterus.  ?Respiratory:  Negative for chest tightness, cough and shortness of breath.   ?Cardiovascular:   Negative for chest pain, leg swelling and palpitations.  ?Gastrointestinal:  Negative for abdominal distention, abdominal pain, constipation, diarrhea, nausea and vomiting.  ?Endocrine: Negative for hot flashes.  ?Genitourinary:  Negative for difficulty urinating.   ?Musculoskeletal:  Negative for arthralgias.  ?Skin:  Negative for itching and rash.  ?Neurological:  Negative for dizziness, extremity weakness, headaches and numbness.  ?Hematological:  Negative for adenopathy. Does not bruise/bleed easily.  ?Psychiatric/Behavioral:  Negative for depression. The patient is not nervous/anxious.    ? ? ?PHYSICAL EXAMINATION ? ?ECOG PERFORMANCE STATUS: 1 - Symptomatic but completely ambulatory ? ?Vitals:  ? 04/10/21 0838  ?BP: (!) 113/57  ?Pulse: 93  ?Resp: 16  ?Temp: 97.7 ?F (36.5 ?C)  ?SpO2: 100%  ? ? ? ?Physical Exam ?Constitutional:   ?   General: She is not in acute distress. ?   Appearance: Normal appearance. She is not toxic-appearing.  ?HENT:  ?   Head: Normocephalic and atraumatic.  ?Eyes:  ?   General: No scleral icterus. ?Cardiovascular:  ?   Rate and Rhythm: Normal rate and regular rhythm.  ?   Pulses: Normal pulses.  ?   Heart sounds: Normal heart sounds.  ?Pulmonary:  ?   Effort: Pulmonary effort is normal.  ?   Breath sounds: Normal breath sounds.  ?Chest:  ?   Comments: Left breast with UOQ 4-3 cms mass, not enlarged. ?Tip of the mass is softer, looks slightly erythematous ?No enlarged LN. ?Abdominal:  ?   General: Abdomen is flat. Bowel sounds are normal. There is no distension.  ?   Palpations: Abdomen is soft.  ?   Tenderness: There is no abdominal tenderness.  ?Musculoskeletal:     ?   General: No swelling.  ?   Cervical back: Neck supple.  ?Lymphadenopathy:  ?   Cervical: No cervical adenopathy.  ?Skin: ?   General: Skin is warm and dry.  ?   Findings: No rash.  ?Neurological:  ?   General: No focal deficit present.  ?   Mental Status: She is alert.  ?Psychiatric:     ?   Mood and Affect: Mood normal.      ?   Behavior: Behavior normal.  ? ? ?LABORATORY DATA: ? ?CBC ?   ?Component Value Date/Time  ? WBC 2.6 (L) 04/10/2021 0821  ? WBC 5.7 11/23/2006 0325  ? RBC 3.78 (L) 04/10/2021 0821  ? HGB 10.4 (L) 04/10/2021 0821  ? HCT 32.6 (L) 04/10/2021 0821  ? PLT 184 04/10/2021 0821  ? MCV 86.2 04/10/2021 0821  ? MCH 27.5 04/10/2021 0821  ? MCHC 31.9 04/10/2021 0821  ? RDW 12.7 04/10/2021 0821  ? LYMPHSABS 1.0 04/10/2021 0821  ? MONOABS 0.1 04/10/2021 0821  ? EOSABS 0.1 04/10/2021 0821  ? BASOSABS 0.0 04/10/2021 0821  ? ? ?CMP  ?   ?Component Value Date/Time  ? NA 139 04/03/2021 0812  ? K 4.0 04/03/2021 0812  ? CL 105 04/03/2021 0812  ? CO2 29 04/03/2021 0812  ?   GLUCOSE 100 (H) 04/03/2021 7124  ? BUN 19 04/03/2021 0812  ? CREATININE 0.76 04/03/2021 0812  ? CALCIUM 9.6 04/03/2021 0812  ? PROT 7.5 04/03/2021 0812  ? ALBUMIN 3.9 04/03/2021 0812  ? AST 29 04/03/2021 0812  ? ALT 27 04/03/2021 0812  ? ALKPHOS 78 04/03/2021 0812  ? BILITOT 0.9 04/03/2021 0812  ? GFRNONAA >60 04/03/2021 5809  ? ? ? ? ?ASSESSMENT and THERAPY PLAN:  ? 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 ? 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 ? ? ?Treatment Plan:  ?1. Neoadjuvant chemotherapy with pembrolizumab, carboplatin, Taxol followed by pembrolizumab, Adriamycin, Cytoxan. ?2. Followed by breast conserving surgery with sentinel lymph node study ?3. Followed by adjuvant radiation therapy ?No role for antiestrogen therapy since this is a triple negative breast cancer ?4. Adjuvant immunotherapy for 9 cycles. ? ?She is here before her planned cycle of chemotherapy ? ?So far tolerating chemotherapy well.  She denies any major side effects ?Ok to proceed with chemo with ANC 1400, if  rest of the parameters are all met. ?We discussed the caloric requirement to not lose weight dramatically, encouraged adequate calories or energy supplements to maintain weight.  We discussed about increas

## 2021-04-11 ENCOUNTER — Other Ambulatory Visit: Payer: PRIVATE HEALTH INSURANCE

## 2021-04-11 ENCOUNTER — Inpatient Hospital Stay: Payer: PRIVATE HEALTH INSURANCE

## 2021-04-11 VITALS — BP 98/61 | HR 91 | Temp 98.4°F | Resp 16

## 2021-04-11 DIAGNOSIS — C50412 Malignant neoplasm of upper-outer quadrant of left female breast: Secondary | ICD-10-CM

## 2021-04-11 LAB — T4: T4, Total: 6.5 ug/dL (ref 4.5–12.0)

## 2021-04-11 MED ORDER — PALONOSETRON HCL INJECTION 0.25 MG/5ML
0.2500 mg | Freq: Once | INTRAVENOUS | Status: AC
  Administered 2021-04-11: 0.25 mg via INTRAVENOUS
  Filled 2021-04-11: qty 5

## 2021-04-11 MED ORDER — CETIRIZINE HCL 10 MG/ML IV SOLN
10.0000 mg | Freq: Once | INTRAVENOUS | Status: AC
  Administered 2021-04-11: 10 mg via INTRAVENOUS
  Filled 2021-04-11: qty 1

## 2021-04-11 MED ORDER — SODIUM CHLORIDE 0.9% FLUSH
10.0000 mL | INTRAVENOUS | Status: DC | PRN
  Administered 2021-04-11: 10 mL

## 2021-04-11 MED ORDER — SODIUM CHLORIDE 0.9 % IV SOLN
80.0000 mg/m2 | Freq: Once | INTRAVENOUS | Status: AC
  Administered 2021-04-11: 126 mg via INTRAVENOUS
  Filled 2021-04-11: qty 21

## 2021-04-11 MED ORDER — SODIUM CHLORIDE 0.9 % IV SOLN
135.0000 mg | Freq: Once | INTRAVENOUS | Status: AC
  Administered 2021-04-11: 140 mg via INTRAVENOUS
  Filled 2021-04-11: qty 14

## 2021-04-11 MED ORDER — SODIUM CHLORIDE 0.9 % IV SOLN
10.0000 mg | Freq: Once | INTRAVENOUS | Status: AC
  Administered 2021-04-11: 10 mg via INTRAVENOUS
  Filled 2021-04-11: qty 10

## 2021-04-11 MED ORDER — FAMOTIDINE IN NACL 20-0.9 MG/50ML-% IV SOLN
20.0000 mg | Freq: Once | INTRAVENOUS | Status: AC
  Administered 2021-04-11: 20 mg via INTRAVENOUS
  Filled 2021-04-11: qty 50

## 2021-04-11 MED ORDER — SODIUM CHLORIDE 0.9 % IV SOLN: Freq: Once | INTRAVENOUS | Status: AC

## 2021-04-11 MED ORDER — HEPARIN SOD (PORK) LOCK FLUSH 100 UNIT/ML IV SOLN
500.0000 [IU] | Freq: Once | INTRAVENOUS | Status: AC | PRN
  Administered 2021-04-11: 500 [IU]

## 2021-04-11 NOTE — Patient Instructions (Signed)
[facility]  Discharge Instructions: Thank you for choosing [facility] to provide your oncology and hematology care.   If you have a lab appointment with the Cancer Center, please go directly to the Cancer Center and check in at the registration area.   Wear comfortable clothing and clothing appropriate for easy access to any Portacath or PICC line.   We strive to give you quality time with your provider. You may need to reschedule your appointment if you arrive late (15 or more minutes).  Arriving late affects you and other patients whose appointments are after yours.  Also, if you miss three or more appointments without notifying the office, you may be dismissed from the clinic at the provider's discretion.      For prescription refill requests, have your pharmacy contact our office and allow 72 hours for refills to be completed.    Today you received the following chemotherapy and/or immunotherapy agents: Taxol & Carboplatin   To help prevent nausea and vomiting after your treatment, we encourage you to take your nausea medication as directed.  BELOW ARE SYMPTOMS THAT SHOULD BE REPORTED IMMEDIATELY: *FEVER GREATER THAN 100.4 F (38 C) OR HIGHER *CHILLS OR SWEATING *NAUSEA AND VOMITING THAT IS NOT CONTROLLED WITH YOUR NAUSEA MEDICATION *UNUSUAL SHORTNESS OF BREATH *UNUSUAL BRUISING OR BLEEDING *URINARY PROBLEMS (pain or burning when urinating, or frequent urination) *BOWEL PROBLEMS (unusual diarrhea, constipation, pain near the anus) TENDERNESS IN MOUTH AND THROAT WITH OR WITHOUT PRESENCE OF ULCERS (sore throat, sores in mouth, or a toothache) UNUSUAL RASH, SWELLING OR PAIN  UNUSUAL VAGINAL DISCHARGE OR ITCHING   Items with * indicate a potential emergency and should be followed up as soon as possible or go to the Emergency Department if any problems should occur.  Please show the CHEMOTHERAPY ALERT CARD or IMMUNOTHERAPY ALERT CARD at  check-in to the Emergency Department and triage nurse.  Should you have questions after your visit or need to cancel or reschedule your appointment, please contact  [facility]ANCER CENTER MEDICAL ONCOLOGY  Dept: [phone]  and follow the prompts.  Office hours are 8:00 a.m. to 4:30 p.m. Monday - Friday. Please note that voicemails left after 4:00 p.m. may not be returned until the following business day.  We are closed weekends and major holidays. You have access to a nurse at all times for urgent questions. Please call the main number to the clinic Dept: [phone] and follow the prompts.   For any non-urgent questions, you may also contact your provider using MyChart. We now offer e-Visits for anyone 18 and older to request care online for non-urgent symptoms. For details visit [email or web address].   Also download the MyChart app! Go to the app store, search "MyChart", open the app, select , and log in with your MyChart username and password.  Due to Covid, a mask is required upon entering the hospital/clinic. If you do not have a mask, one will be given to you upon arrival. For doctor visits, patients may have 1 support person aged 18 or older with them. For treatment visits, patients cannot have anyone with them due to current Covid guidelines and our immunocompromised population.   

## 2021-04-12 ENCOUNTER — Inpatient Hospital Stay: Payer: PRIVATE HEALTH INSURANCE

## 2021-04-12 ENCOUNTER — Other Ambulatory Visit: Payer: Self-pay

## 2021-04-12 VITALS — BP 126/80 | HR 109 | Temp 97.5°F | Resp 19

## 2021-04-12 DIAGNOSIS — C50412 Malignant neoplasm of upper-outer quadrant of left female breast: Secondary | ICD-10-CM

## 2021-04-12 MED ORDER — FILGRASTIM-AAFI 300 MCG/0.5ML IJ SOSY
300.0000 ug | PREFILLED_SYRINGE | Freq: Once | INTRAMUSCULAR | Status: AC
  Administered 2021-04-12: 300 ug via SUBCUTANEOUS
  Filled 2021-04-12: qty 0.5

## 2021-04-12 NOTE — Patient Instructions (Signed)
Filgrastim, G-CSF injection °What is this medication? °FILGRASTIM, G-CSF (fil GRA stim) is a granulocyte colony-stimulating factor that stimulates the growth of neutrophils, a type of white blood cell (WBC) important in the body's fight against infection. It is used to reduce the incidence of fever and infection in patients with certain types of cancer who are receiving chemotherapy that affects the bone marrow, to stimulate blood cell production for removal of WBCs from the body prior to a bone marrow transplantation, to reduce the incidence of fever and infection in patients who have severe chronic neutropenia, and to improve survival outcomes following high-dose radiation exposure that is toxic to the bone marrow. °This medicine may be used for other purposes; ask your health care provider or pharmacist if you have questions. °COMMON BRAND NAME(S): Neupogen, Nivestym, Releuko, Zarxio °What should I tell my care team before I take this medication? °They need to know if you have any of these conditions: °kidney disease °latex allergy °ongoing radiation therapy °sickle cell disease °an unusual or allergic reaction to filgrastim, pegfilgrastim, other medicines, foods, dyes, or preservatives °pregnant or trying to get pregnant °breast-feeding °How should I use this medication? °This medicine is for injection under the skin or infusion into a vein. As an infusion into a vein, it is usually given by a health care professional in a hospital or clinic setting. If you get this medicine at home, you will be taught how to prepare and give this medicine. Refer to the Instructions for Use that come with your medication packaging. Use exactly as directed. Take your medicine at regular intervals. Do not take your medicine more often than directed. °It is important that you put your used needles and syringes in a special sharps container. Do not put them in a trash can. If you do not have a sharps container, call your pharmacist  or healthcare provider to get one. °Talk to your pediatrician regarding the use of this medicine in children. While this drug may be prescribed for children as young as 7 months for selected conditions, precautions do apply. °Overdosage: If you think you have taken too much of this medicine contact a poison control center or emergency room at once. °NOTE: This medicine is only for you. Do not share this medicine with others. °What if I miss a dose? °It is important not to miss your dose. Call your doctor or health care professional if you miss a dose. °What may interact with this medication? °This medicine may interact with the following medications: °medicines that may cause a release of neutrophils, such as lithium °This list may not describe all possible interactions. Give your health care provider a list of all the medicines, herbs, non-prescription drugs, or dietary supplements you use. Also tell them if you smoke, drink alcohol, or use illegal drugs. Some items may interact with your medicine. °What should I watch for while using this medication? °Your condition will be monitored carefully while you are receiving this medicine. °You may need blood work done while you are taking this medicine. °Talk to your health care provider about your risk of cancer. You may be more at risk for certain types of cancer if you take this medicine. °What side effects may I notice from receiving this medication? °Side effects that you should report to your doctor or health care professional as soon as possible: °allergic reactions like skin rash, itching or hives, swelling of the face, lips, or tongue °back pain °dizziness or feeling faint °fever °pain, redness, or   irritation at site where injected °pinpoint red spots on the skin °shortness of breath or breathing problems °signs and symptoms of kidney injury like trouble passing urine, change in the amount of urine, or red or dark-brown urine °stomach or side pain, or pain at  the shoulder °swelling °tiredness °unusual bleeding or bruising °Side effects that usually do not require medical attention (report to your doctor or health care professional if they continue or are bothersome): °bone pain °cough °diarrhea °hair loss °headache °muscle pain °This list may not describe all possible side effects. Call your doctor for medical advice about side effects. You may report side effects to FDA at 1-800-FDA-1088. °Where should I keep my medication? °Keep out of the reach of children. °Store in a refrigerator between 2 and 8 degrees C (36 and 46 degrees F). Do not freeze. Keep in carton to protect from light. Throw away this medicine if vials or syringes are left out of the refrigerator for more than 24 hours. Throw away any unused medicine after the expiration date. °NOTE: This sheet is a summary. It may not cover all possible information. If you have questions about this medicine, talk to your doctor, pharmacist, or health care provider. °© 2022 Elsevier/Gold Standard (2020-10-01 00:00:00) ° °

## 2021-04-14 ENCOUNTER — Other Ambulatory Visit: Payer: Self-pay

## 2021-04-14 ENCOUNTER — Inpatient Hospital Stay: Payer: PRIVATE HEALTH INSURANCE

## 2021-04-14 DIAGNOSIS — Z171 Estrogen receptor negative status [ER-]: Secondary | ICD-10-CM

## 2021-04-14 DIAGNOSIS — C50412 Malignant neoplasm of upper-outer quadrant of left female breast: Secondary | ICD-10-CM | POA: Diagnosis not present

## 2021-04-14 MED ORDER — FILGRASTIM-AAFI 300 MCG/0.5ML IJ SOSY
300.0000 ug | PREFILLED_SYRINGE | Freq: Once | INTRAMUSCULAR | Status: AC
  Administered 2021-04-14: 300 ug via SUBCUTANEOUS
  Filled 2021-04-14: qty 0.5

## 2021-04-15 ENCOUNTER — Inpatient Hospital Stay: Payer: PRIVATE HEALTH INSURANCE

## 2021-04-15 ENCOUNTER — Encounter: Payer: Self-pay | Admitting: Hematology and Oncology

## 2021-04-15 VITALS — BP 110/66 | HR 85 | Temp 98.8°F | Resp 18

## 2021-04-15 DIAGNOSIS — C50412 Malignant neoplasm of upper-outer quadrant of left female breast: Secondary | ICD-10-CM

## 2021-04-15 MED ORDER — FILGRASTIM-AAFI 300 MCG/0.5ML IJ SOSY
300.0000 ug | PREFILLED_SYRINGE | Freq: Once | INTRAMUSCULAR | Status: AC
  Administered 2021-04-15: 300 ug via SUBCUTANEOUS
  Filled 2021-04-15: qty 0.5

## 2021-04-15 NOTE — Progress Notes (Signed)
Met w/ pt to introduce myself as her Arboriculturist and to discuss copay assistance on 04/11/21.  Pt would like to apply and gave me consent to apply in her behalf so I completed the application w/ [employer] for [employer] and the [employer] for [employer].  I got hers and will get Dr. Rob Hickman signature for both applications and fax to [employer] and [employer] for processing.  I will notify the pt of the outcome once I receive it.  I informed her of the [employer], went over what it covers and gave her the income requirement.  She stated she would like to think about it and let me know at a later date if she would like to apply.  She has my card for any questions or concerns she may have in the future.  ?

## 2021-04-17 ENCOUNTER — Encounter: Payer: Self-pay | Admitting: Hematology and Oncology

## 2021-04-17 NOTE — Progress Notes (Signed)
Received a call from the [place] denying the pt's application for copay assistance stating that the pt's insurance is not real insurance so they cannot approve her for copay assistance.  I relayed the message to the pt and informed her that the [employer] will most likely say the same thing.  She verbalized understanding.  ?

## 2021-04-18 ENCOUNTER — Inpatient Hospital Stay (HOSPITAL_BASED_OUTPATIENT_CLINIC_OR_DEPARTMENT_OTHER): Payer: PRIVATE HEALTH INSURANCE | Admitting: Hematology and Oncology

## 2021-04-18 ENCOUNTER — Inpatient Hospital Stay: Payer: PRIVATE HEALTH INSURANCE

## 2021-04-18 ENCOUNTER — Other Ambulatory Visit: Payer: Self-pay

## 2021-04-18 DIAGNOSIS — Z171 Estrogen receptor negative status [ER-]: Secondary | ICD-10-CM | POA: Diagnosis not present

## 2021-04-18 DIAGNOSIS — E559 Vitamin D deficiency, unspecified: Secondary | ICD-10-CM

## 2021-04-18 DIAGNOSIS — C50412 Malignant neoplasm of upper-outer quadrant of left female breast: Secondary | ICD-10-CM | POA: Diagnosis not present

## 2021-04-18 DIAGNOSIS — Z95828 Presence of other vascular implants and grafts: Secondary | ICD-10-CM

## 2021-04-18 DIAGNOSIS — R5383 Other fatigue: Secondary | ICD-10-CM

## 2021-04-18 LAB — CBC WITH DIFFERENTIAL (CANCER CENTER ONLY)
Abs Immature Granulocytes: 0.24 10*3/uL — ABNORMAL HIGH (ref 0.00–0.07)
Basophils Absolute: 0 10*3/uL (ref 0.0–0.1)
Basophils Relative: 1 %
Eosinophils Absolute: 0.1 10*3/uL (ref 0.0–0.5)
Eosinophils Relative: 1 %
HCT: 32.2 % — ABNORMAL LOW (ref 36.0–46.0)
Hemoglobin: 10.3 g/dL — ABNORMAL LOW (ref 12.0–15.0)
Immature Granulocytes: 4 %
Lymphocytes Relative: 27 %
Lymphs Abs: 1.5 10*3/uL (ref 0.7–4.0)
MCH: 27.5 pg (ref 26.0–34.0)
MCHC: 32 g/dL (ref 30.0–36.0)
MCV: 86.1 fL (ref 80.0–100.0)
Monocytes Absolute: 0.6 10*3/uL (ref 0.1–1.0)
Monocytes Relative: 12 %
Neutro Abs: 3.1 10*3/uL (ref 1.7–7.7)
Neutrophils Relative %: 55 %
Platelet Count: 198 10*3/uL (ref 150–400)
RBC: 3.74 MIL/uL — ABNORMAL LOW (ref 3.87–5.11)
RDW: 13.7 % (ref 11.5–15.5)
WBC Count: 5.6 10*3/uL (ref 4.0–10.5)
nRBC: 0 % (ref 0.0–0.2)

## 2021-04-18 LAB — CMP (CANCER CENTER ONLY)
ALT: 33 U/L (ref 0–44)
AST: 25 U/L (ref 15–41)
Albumin: 3.8 g/dL (ref 3.5–5.0)
Alkaline Phosphatase: 90 U/L (ref 38–126)
Anion gap: 5 (ref 5–15)
BUN: 20 mg/dL (ref 6–20)
CO2: 30 mmol/L (ref 22–32)
Calcium: 9.6 mg/dL (ref 8.9–10.3)
Chloride: 104 mmol/L (ref 98–111)
Creatinine: 0.84 mg/dL (ref 0.44–1.00)
GFR, Estimated: >60 mL/min (ref >60)
Glucose, Bld: 135 mg/dL — ABNORMAL HIGH (ref 70–99)
Potassium: 3.8 mmol/L (ref 3.5–5.1)
Sodium: 139 mmol/L (ref 135–145)
Total Bilirubin: 0.6 mg/dL (ref 0.3–1.2)
Total Protein: 7.4 g/dL (ref 6.5–8.1)

## 2021-04-18 LAB — TSH: TSH: 1.938 u[IU]/mL (ref 0.308–3.960)

## 2021-04-18 LAB — VITAMIN D 25 HYDROXY (VIT D DEFICIENCY, FRACTURES): Vit D, 25-Hydroxy: 34.91 ng/mL (ref 30–100)

## 2021-04-18 MED ORDER — PALONOSETRON HCL INJECTION 0.25 MG/5ML
0.2500 mg | Freq: Once | INTRAVENOUS | Status: AC
  Administered 2021-04-18: 0.25 mg via INTRAVENOUS
  Filled 2021-04-18: qty 5

## 2021-04-18 MED ORDER — SODIUM CHLORIDE 0.9 % IV SOLN
130.3500 mg | Freq: Once | INTRAVENOUS | Status: AC
  Administered 2021-04-18: 130 mg via INTRAVENOUS
  Filled 2021-04-18: qty 13

## 2021-04-18 MED ORDER — CETIRIZINE HCL 10 MG/ML IV SOLN
10.0000 mg | Freq: Once | INTRAVENOUS | Status: AC
  Administered 2021-04-18: 10 mg via INTRAVENOUS
  Filled 2021-04-18: qty 1

## 2021-04-18 MED ORDER — SODIUM CHLORIDE 0.9 % IV SOLN: Freq: Once | INTRAVENOUS | Status: AC

## 2021-04-18 MED ORDER — SODIUM CHLORIDE 0.9% FLUSH
10.0000 mL | INTRAVENOUS | Status: DC | PRN
  Administered 2021-04-18: 10 mL

## 2021-04-18 MED ORDER — SODIUM CHLORIDE 0.9 % IV SOLN
80.0000 mg/m2 | Freq: Once | INTRAVENOUS | Status: AC
  Administered 2021-04-18: 126 mg via INTRAVENOUS
  Filled 2021-04-18: qty 21

## 2021-04-18 MED ORDER — SODIUM CHLORIDE 0.9% FLUSH
10.0000 mL | INTRAVENOUS | Status: AC | PRN
  Administered 2021-04-18: 10 mL

## 2021-04-18 MED ORDER — SODIUM CHLORIDE 0.9 % IV SOLN
10.0000 mg | Freq: Once | INTRAVENOUS | Status: AC
  Administered 2021-04-18: 10 mg via INTRAVENOUS
  Filled 2021-04-18: qty 10

## 2021-04-18 MED ORDER — SODIUM CHLORIDE 0.9 % IV SOLN
200.0000 mg | Freq: Once | INTRAVENOUS | Status: AC
  Administered 2021-04-18: 200 mg via INTRAVENOUS
  Filled 2021-04-18: qty 200

## 2021-04-18 MED ORDER — FAMOTIDINE IN NACL 20-0.9 MG/50ML-% IV SOLN
20.0000 mg | Freq: Once | INTRAVENOUS | Status: AC
  Administered 2021-04-18: 20 mg via INTRAVENOUS
  Filled 2021-04-18: qty 50

## 2021-04-18 MED ORDER — HEPARIN SOD (PORK) LOCK FLUSH 100 UNIT/ML IV SOLN
500.0000 [IU] | Freq: Once | INTRAVENOUS | Status: AC | PRN
  Administered 2021-04-18: 500 [IU]

## 2021-04-18 NOTE — Assessment & Plan Note (Signed)
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 ? 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 ? ? ?Treatment Plan:  ?1. Neoadjuvant chemotherapy with pembrolizumab, carboplatin, Taxol followed by pembrolizumab, Adriamycin, Cytoxan. ?2. Followed by breast conserving surgery with sentinel lymph node study ?3. Followed by adjuvant radiation therapy ?No role for antiestrogen therapy since this is a triple negative breast cancer ?4. Adjuvant immunotherapy for 9 cycles. ? ?She is tolerating carbotaxol/keytruda well. No major adverse effects reported. ?Labs today satisfactory to proceed with chemoimmunotherapy.   ?Physical examination, tumor slightly smaller compared to last visit consistent with response.  Currently measures 3 x 2.5 cm, last 1 measured 4 x 3 cm.  The tumor also is softer to palpation suggestive of necrosis. ? ?She will proceed with chemotherapy as scheduled and will return to clinic for next cycle of chemoimmunotherapy. ?

## 2021-04-18 NOTE — Patient Instructions (Signed)
[facility]   Discharge Instructions: Thank you for choosing [facility] to provide your oncology and hematology care.   If you have a lab appointment with the Cancer Center, please go directly to the Cancer Center and check in at the registration area.   Wear comfortable clothing and clothing appropriate for easy access to any Portacath or PICC line.   We strive to give you quality time with your provider. You may need to reschedule your appointment if you arrive late (15 or more minutes).  Arriving late affects you and other patients whose appointments are after yours.  Also, if you miss three or more appointments without notifying the office, you may be dismissed from the clinic at the provider's discretion.      For prescription refill requests, have your pharmacy contact our office and allow 72 hours for refills to be completed.    Today you received the following chemotherapy and/or immunotherapy agents: Pembrolizumab (Keytruda), Paclitaxel (Taxol), and Carboplatin      To help prevent nausea and vomiting after your treatment, we encourage you to take your nausea medication as directed.  BELOW ARE SYMPTOMS THAT SHOULD BE REPORTED IMMEDIATELY: *FEVER GREATER THAN 100.4 F (38 C) OR HIGHER *CHILLS OR SWEATING *NAUSEA AND VOMITING THAT IS NOT CONTROLLED WITH YOUR NAUSEA MEDICATION *UNUSUAL SHORTNESS OF BREATH *UNUSUAL BRUISING OR BLEEDING *URINARY PROBLEMS (pain or burning when urinating, or frequent urination) *BOWEL PROBLEMS (unusual diarrhea, constipation, pain near the anus) TENDERNESS IN MOUTH AND THROAT WITH OR WITHOUT PRESENCE OF ULCERS (sore throat, sores in mouth, or a toothache) UNUSUAL RASH, SWELLING OR PAIN  UNUSUAL VAGINAL DISCHARGE OR ITCHING   Items with * indicate a potential emergency and should be followed up as soon as possible or go to the Emergency Department if any problems should occur.  Please show the CHEMOTHERAPY  ALERT CARD or IMMUNOTHERAPY ALERT CARD at check-in to the Emergency Department and triage nurse.  Should you have questions after your visit or need to cancel or reschedule your appointment, please contact [facility]  Dept: [phone]  and follow the prompts.  Office hours are 8:00 a.m. to 4:30 p.m. Monday - Friday. Please note that voicemails left after 4:00 p.m. may not be returned until the following business day.  We are closed weekends and major holidays. You have access to a nurse at all times for urgent questions. Please call the main number to the clinic Dept: [phone] and follow the prompts.   For any non-urgent questions, you may also contact your provider using MyChart. We now offer e-Visits for anyone 18 and older to request care online for non-urgent symptoms. For details visit [email or web address].   Also download the MyChart app! Go to the app store, search "MyChart", open the app, select [facility], and log in with your MyChart username and password.  Due to Covid, a mask is required upon entering the hospital/clinic. If you do not have a mask, one will be given to you upon arrival. For doctor visits, patients may have 1 support person aged 18 or older with them. For treatment visits, patients cannot have anyone with them due to current Covid guidelines and our immunocompromised population.   

## 2021-04-18 NOTE — Progress Notes (Signed)
[facility]: ?  ? ?[address] ?[address], [address] ?[place] [address] ? ? ?DIAGNOSIS:  Cancer Staging  ?Malignant neoplasm of upper-outer quadrant of left breast in female, estrogen receptor negative ([place]) ?Staging form: Breast, AJCC 8th Edition ?- Clinical stage from 03/07/2021: Stage IIB (cT2, cN0, cM0, G3, ER-, PR-, HER2-) - Signed by Nicholas Lose, MD on 03/07/2021 ?Stage prefix: Initial diagnosis ?Histologic grading system: 3 grade system ? ? ?SUMMARY OF ONCOLOGIC HISTORY: ?Oncology History  ?Malignant neoplasm of upper-outer quadrant of left breast in female, estrogen receptor negative ([place])  ?02/12/2021 Initial Diagnosis  ? palpable solid mass in the left upper outer quadrant. Diagnostic mammogram on 02/12/2021 showed a 3 cm mass in the left upper outer quadrant. Biopsy on 02/20/2021 showed grade 3 invasive ductal carcinoma, ER/PR/Her2-([facility]) ?  ?03/07/2021 Cancer Staging  ? Staging form: Breast, AJCC 8th Edition ?- Clinical stage from 03/07/2021: Stage IIB (cT2, cN0, cM0, G3, ER-, PR-, HER2-) - Signed by Nicholas Lose, MD on 03/07/2021 ?Stage prefix: Initial diagnosis ?Histologic grading system: 3 grade system ? ?  ?03/28/2021 -  Chemotherapy  ? Patient is on Treatment Plan : BREAST Pembrolizumab (200) D1 + Carboplatin (1.5) D1,8,15 + Paclitaxel (80) D1,8,15 q21d X 4 cycles / Pembrolizumab (200) D1 + AC D1 q21d x 4 cycles  ?   ? ? ?CURRENT THERAPY: Taxol, Carbo, Pembrolizumab ? ?INTERVAL HISTORY: ? ?Amanda Smith 56 y.o. female returns for evaluation prior to receiving her next cycle of neoadjuvant chemotherapy.  Baseline ECHO March 21, 2021 and showed a normal left ventricular ejection fraction of 60 to 65%. ?She is doing very well so far. She doesn't check her breast mass so doesn't know if it shrunk. ?No nausea, vomiting, diarrhea. No fevers or chills. ?No neuropathy reported ?She started losing hair and this is what worried her. ?Rest of  the pertinent 10 point ROS reviewed and negative. ? ?Patient Active Problem List  ? Diagnosis Date Noted  ? Malignant neoplasm of upper-outer quadrant of left breast in female, estrogen receptor negative ([place]) 03/06/2021  ? ? ?is allergic to benadryl [diphenhydramine]. ? ?MEDICAL HISTORY: ?Past Medical History:  ?Diagnosis Date  ? Breast cancer ([place])   ? ? ?SURGICAL HISTORY: ?Past Surgical History:  ?Procedure Laterality Date  ? CESAREAN SECTION    ? PORTACATH PLACEMENT Right 03/18/2021  ? Procedure: INSERTION PORT-A-CATH;  Surgeon: Rolm Bookbinder, MD;  Location: [facility];  Service: General;  Laterality: Right;  ? WRIST FRACTURE SURGERY    ? ? ?SOCIAL HISTORY: ?Social History  ? ?Socioeconomic History  ? Marital status: Married  ?  Spouse name: Not on file  ? Number of children: Not on file  ? Years of education: Not on file  ? Highest education level: Not on file  ?Occupational History  ? Not on file  ?Tobacco Use  ? Smoking status: Never  ? Smokeless tobacco: Never  ?Vaping Use  ? Vaping Use: Never used  ?Substance and Sexual Activity  ? Alcohol use: Never  ? Drug use: Never  ? Sexual activity: Yes  ?  Birth control/protection: Post-menopausal  ?Other Topics Concern  ? Not on file  ?Social History Narrative  ? Not on file  ? ?Social Determinants of Health  ? ?Financial Resource Strain: Not on file  ?Food Insecurity: Not on file  ?Transportation Needs: Not on file  ?Physical Activity: Not on file  ?Stress: Not on file  ?Social Connections: Not  on file  ?Intimate Partner Violence: Not on file  ? ? ?FAMILY HISTORY: ?Family History  ?Problem Relation Age of Onset  ? Breast cancer Sister   ? ? ?Review of Systems  ?Constitutional:  Negative for appetite change, chills, fatigue, fever and unexpected weight change.  ?HENT:   Negative for hearing loss, lump/mass and trouble swallowing.   ?Eyes:  Negative for eye problems and icterus.  ?Respiratory:  Negative for chest tightness, cough and shortness of  breath.   ?Cardiovascular:  Negative for chest pain, leg swelling and palpitations.  ?Gastrointestinal:  Negative for abdominal distention, abdominal pain, constipation, diarrhea, nausea and vomiting.  ?Endocrine: Negative for hot flashes.  ?Genitourinary:  Negative for difficulty urinating.   ?Musculoskeletal:  Negative for arthralgias.  ?Skin:  Negative for itching and rash.  ?Neurological:  Negative for dizziness, extremity weakness, headaches and numbness.  ?Hematological:  Negative for adenopathy. Does not bruise/bleed easily.  ?Psychiatric/Behavioral:  Negative for depression. The patient is not nervous/anxious.    ? ? ?PHYSICAL EXAMINATION ? ?ECOG PERFORMANCE STATUS: 1 - Symptomatic but completely ambulatory ? ?Vitals:  ? 04/18/21 1154  ?BP: (!) 100/54  ?Pulse: 83  ?Resp: 18  ?Temp: 98.1 ?F (36.7 ?C)  ?SpO2: 100%  ? ? ? ?Physical Exam ?Constitutional:   ?   General: She is not in acute distress. ?   Appearance: Normal appearance. She is not toxic-appearing.  ?HENT:  ?   Head: Normocephalic and atraumatic.  ?Eyes:  ?   General: No scleral icterus. ?Cardiovascular:  ?   Rate and Rhythm: Normal rate and regular rhythm.  ?   Pulses: Normal pulses.  ?   Heart sounds: Normal heart sounds.  ?Pulmonary:  ?   Effort: Pulmonary effort is normal.  ?   Breath sounds: Normal breath sounds.  ?Chest:  ?   Comments: Left breast with UOQ 4-3 cms mass, not enlarged. ?Tip of the mass is softer, looks slightly erythematous ?No enlarged LN. ?Abdominal:  ?   General: Abdomen is flat. Bowel sounds are normal. There is no distension.  ?   Palpations: Abdomen is soft.  ?   Tenderness: There is no abdominal tenderness.  ?Musculoskeletal:     ?   General: No swelling.  ?   Cervical back: Neck supple.  ?Lymphadenopathy:  ?   Cervical: No cervical adenopathy.  ?Skin: ?   General: Skin is warm and dry.  ?   Findings: No rash.  ?Neurological:  ?   General: No focal deficit present.  ?   Mental Status: She is alert.  ?Psychiatric:     ?    Mood and Affect: Mood normal.     ?   Behavior: Behavior normal.  ? ? ?LABORATORY DATA: ? ?CBC ?   ?Component Value Date/Time  ? WBC 5.6 04/18/2021 1123  ? WBC 5.7 11/23/2006 0325  ? RBC 3.74 (L) 04/18/2021 1123  ? HGB 10.3 (L) 04/18/2021 1123  ? HCT 32.2 (L) 04/18/2021 1123  ? PLT 198 04/18/2021 1123  ? MCV 86.1 04/18/2021 1123  ? MCH 27.5 04/18/2021 1123  ? MCHC 32.0 04/18/2021 1123  ? RDW 13.7 04/18/2021 1123  ? LYMPHSABS 1.5 04/18/2021 1123  ? MONOABS 0.6 04/18/2021 1123  ? EOSABS 0.1 04/18/2021 1123  ? BASOSABS 0.0 04/18/2021 1123  ? ? ?CMP  ?   ?Component Value Date/Time  ? NA 139 04/18/2021 1123  ? K 3.8 04/18/2021 1123  ? CL 104 04/18/2021 1123  ? CO2 30 04/18/2021 1123  ?  GLUCOSE 135 (H) 04/18/2021 1123  ? BUN 20 04/18/2021 1123  ? CREATININE 0.84 04/18/2021 1123  ? CALCIUM 9.6 04/18/2021 1123  ? PROT 7.4 04/18/2021 1123  ? ALBUMIN 3.8 04/18/2021 1123  ? AST 25 04/18/2021 1123  ? ALT 33 04/18/2021 1123  ? ALKPHOS 90 04/18/2021 1123  ? BILITOT 0.6 04/18/2021 1123  ? GFRNONAA >60 04/18/2021 1123  ? ? ? ? ?ASSESSMENT and THERAPY PLAN:  ? ?Malignant neoplasm of upper-outer quadrant of left breast in female, estrogen receptor negative ([place]) ?Kyeisha is a 56 year old woman with 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 ? 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 ? ? ?Treatment Plan:  ?1. Neoadjuvant chemotherapy with pembrolizumab, carboplatin, Taxol followed by pembrolizumab, Adriamycin, Cytoxan. ?2. Followed by breast conserving surgery with sentinel lymph node study ?3. Followed by adjuvant radiation therapy ?No role for antiestrogen therapy since this is a triple negative breast cancer ?4. Adjuvant immunotherapy for 9 cycles. ? ?She is tolerating carbotaxol/keytruda well. No major adverse effects reported. ?Labs today satisfactory to proceed with chemoimmunotherapy.    ?Physical examination, tumor slightly smaller compared to last visit consistent with response.  Currently measures 3 x 2.5 cm, last 1 measured 4 x 3 cm.  The tumor also is softer to palpation suggestive of n

## 2021-04-19 LAB — T4: T4, Total: 7.1 ug/dL (ref 4.5–12.0)

## 2021-04-21 ENCOUNTER — Telehealth: Payer: Self-pay

## 2021-04-21 NOTE — Telephone Encounter (Signed)
Attempted to call pt regarding results. Pt did not answer. LVM for call back.  ?

## 2021-04-21 NOTE — Telephone Encounter (Signed)
-----   Message from Gardenia Phlegm, NP sent at 04/20/2021  6:09 PM EDT ----- ?Please let patient know her vitamin d level is normal ?----- Message ----- ?From: Interface, [facility] ?Sent: 04/18/2021   2:59 PM EDT ?To: Gardenia Phlegm, NP ? ? ?

## 2021-04-23 ENCOUNTER — Encounter: Payer: Self-pay | Admitting: Hematology and Oncology

## 2021-04-24 MED FILL — Dexamethasone Sodium Phosphate Inj 100 MG/10ML: INTRAMUSCULAR | Qty: 1 | Status: AC

## 2021-04-25 ENCOUNTER — Other Ambulatory Visit: Payer: BC Managed Care – PPO

## 2021-04-25 ENCOUNTER — Inpatient Hospital Stay: Payer: PRIVATE HEALTH INSURANCE

## 2021-04-25 ENCOUNTER — Other Ambulatory Visit: Payer: Self-pay

## 2021-04-25 ENCOUNTER — Ambulatory Visit: Payer: BC Managed Care – PPO

## 2021-04-25 ENCOUNTER — Ambulatory Visit: Payer: BC Managed Care – PPO | Admitting: Hematology and Oncology

## 2021-04-25 VITALS — BP 109/88 | HR 98 | Temp 97.9°F | Resp 18 | Wt 115.2 lb

## 2021-04-25 DIAGNOSIS — Z95828 Presence of other vascular implants and grafts: Secondary | ICD-10-CM

## 2021-04-25 DIAGNOSIS — C50412 Malignant neoplasm of upper-outer quadrant of left female breast: Secondary | ICD-10-CM | POA: Diagnosis not present

## 2021-04-25 LAB — CMP (CANCER CENTER ONLY)
ALT: 25 U/L (ref 0–44)
AST: 26 U/L (ref 15–41)
Albumin: 3.9 g/dL (ref 3.5–5.0)
Alkaline Phosphatase: 73 U/L (ref 38–126)
Anion gap: 5 (ref 5–15)
BUN: 26 mg/dL — ABNORMAL HIGH (ref 6–20)
CO2: 29 mmol/L (ref 22–32)
Calcium: 9.3 mg/dL (ref 8.9–10.3)
Chloride: 106 mmol/L (ref 98–111)
Creatinine: 0.75 mg/dL (ref 0.44–1.00)
GFR, Estimated: >60 mL/min (ref >60)
Glucose, Bld: 135 mg/dL — ABNORMAL HIGH (ref 70–99)
Potassium: 3.6 mmol/L (ref 3.5–5.1)
Sodium: 140 mmol/L (ref 135–145)
Total Bilirubin: 1 mg/dL (ref 0.3–1.2)
Total Protein: 7.5 g/dL (ref 6.5–8.1)

## 2021-04-25 LAB — CBC WITH DIFFERENTIAL (CANCER CENTER ONLY)
Abs Immature Granulocytes: 0.01 10*3/uL (ref 0.00–0.07)
Basophils Absolute: 0 10*3/uL (ref 0.0–0.1)
Basophils Relative: 1 %
Eosinophils Absolute: 0 10*3/uL (ref 0.0–0.5)
Eosinophils Relative: 2 %
HCT: 29.7 % — ABNORMAL LOW (ref 36.0–46.0)
Hemoglobin: 9.7 g/dL — ABNORMAL LOW (ref 12.0–15.0)
Immature Granulocytes: 0 %
Lymphocytes Relative: 43 %
Lymphs Abs: 1 10*3/uL (ref 0.7–4.0)
MCH: 28 pg (ref 26.0–34.0)
MCHC: 32.7 g/dL (ref 30.0–36.0)
MCV: 85.8 fL (ref 80.0–100.0)
Monocytes Absolute: 0.2 10*3/uL (ref 0.1–1.0)
Monocytes Relative: 8 %
Neutro Abs: 1.1 10*3/uL — ABNORMAL LOW (ref 1.7–7.7)
Neutrophils Relative %: 46 %
Platelet Count: 181 10*3/uL (ref 150–400)
RBC: 3.46 MIL/uL — ABNORMAL LOW (ref 3.87–5.11)
RDW: 13.7 % (ref 11.5–15.5)
WBC Count: 2.3 10*3/uL — ABNORMAL LOW (ref 4.0–10.5)
nRBC: 0 % (ref 0.0–0.2)

## 2021-04-25 LAB — TSH: TSH: 1.389 u[IU]/mL (ref 0.308–3.960)

## 2021-04-25 MED ORDER — SODIUM CHLORIDE 0.9% FLUSH
10.0000 mL | Freq: Once | INTRAVENOUS | Status: AC
  Administered 2021-04-25: 10 mL via INTRAVENOUS

## 2021-04-25 MED ORDER — HEPARIN SOD (PORK) LOCK FLUSH 100 UNIT/ML IV SOLN
500.0000 [IU] | Freq: Once | INTRAVENOUS | Status: AC
  Administered 2021-04-25: 500 [IU] via INTRAVENOUS

## 2021-04-25 MED ORDER — SODIUM CHLORIDE 0.9% FLUSH
10.0000 mL | INTRAVENOUS | Status: DC | PRN
  Administered 2021-04-25: 10 mL via INTRAVENOUS

## 2021-04-25 NOTE — Progress Notes (Signed)
BP 109/88 (BP Location: Left Arm, Patient Position: Sitting)   Pulse 98   Temp 97.9 ?F (36.6 ?C) (Oral)   Resp 18   Wt 115 lb 4 oz (52.3 kg)   SpO2 100%   BMI 21.08 kg/m?  ?Patient decided based on how she's feeling that she is going to forego treatment for today. MD in the infusion center- will discuss with Dr. Chryl Heck. ? Dr. Chryl Heck aware- saw patient in infusion. ?

## 2021-04-25 NOTE — Patient Instructions (Signed)

## 2021-04-26 ENCOUNTER — Ambulatory Visit: Payer: BC Managed Care – PPO

## 2021-04-26 LAB — T4: T4, Total: 6.6 ug/dL (ref 4.5–12.0)

## 2021-04-28 ENCOUNTER — Ambulatory Visit: Payer: BC Managed Care – PPO

## 2021-04-29 ENCOUNTER — Ambulatory Visit: Payer: BC Managed Care – PPO

## 2021-05-02 ENCOUNTER — Inpatient Hospital Stay: Payer: No Typology Code available for payment source | Admitting: Hematology and Oncology

## 2021-05-02 ENCOUNTER — Other Ambulatory Visit: Payer: Self-pay

## 2021-05-02 ENCOUNTER — Inpatient Hospital Stay: Payer: No Typology Code available for payment source

## 2021-05-02 ENCOUNTER — Inpatient Hospital Stay: Payer: No Typology Code available for payment source | Attending: Hematology and Oncology

## 2021-05-02 VITALS — BP 114/57 | HR 97 | Temp 98.1°F | Resp 18 | Wt 116.2 lb

## 2021-05-02 DIAGNOSIS — Z170 Estrogen receptor positive status [ER+]: Secondary | ICD-10-CM | POA: Insufficient documentation

## 2021-05-02 DIAGNOSIS — Z171 Estrogen receptor negative status [ER-]: Secondary | ICD-10-CM

## 2021-05-02 DIAGNOSIS — Z5111 Encounter for antineoplastic chemotherapy: Secondary | ICD-10-CM | POA: Diagnosis present

## 2021-05-02 DIAGNOSIS — Z95828 Presence of other vascular implants and grafts: Secondary | ICD-10-CM

## 2021-05-02 DIAGNOSIS — Z5189 Encounter for other specified aftercare: Secondary | ICD-10-CM | POA: Diagnosis not present

## 2021-05-02 DIAGNOSIS — C50412 Malignant neoplasm of upper-outer quadrant of left female breast: Secondary | ICD-10-CM | POA: Diagnosis not present

## 2021-05-02 LAB — CMP (CANCER CENTER ONLY)
ALT: 29 U/L (ref 0–44)
AST: 30 U/L (ref 15–41)
Albumin: 3.9 g/dL (ref 3.5–5.0)
Alkaline Phosphatase: 80 U/L (ref 38–126)
Anion gap: 7 (ref 5–15)
BUN: 23 mg/dL — ABNORMAL HIGH (ref 6–20)
CO2: 29 mmol/L (ref 22–32)
Calcium: 9.2 mg/dL (ref 8.9–10.3)
Chloride: 105 mmol/L (ref 98–111)
Creatinine: 0.85 mg/dL (ref 0.44–1.00)
GFR, Estimated: >60 mL/min (ref >60)
Glucose, Bld: 90 mg/dL (ref 70–99)
Potassium: 3.8 mmol/L (ref 3.5–5.1)
Sodium: 141 mmol/L (ref 135–145)
Total Bilirubin: 0.8 mg/dL (ref 0.3–1.2)
Total Protein: 7.7 g/dL (ref 6.5–8.1)

## 2021-05-02 LAB — CBC WITH DIFFERENTIAL (CANCER CENTER ONLY)
Abs Immature Granulocytes: 0.01 10*3/uL (ref 0.00–0.07)
Basophils Absolute: 0 10*3/uL (ref 0.0–0.1)
Basophils Relative: 1 %
Eosinophils Absolute: 0.1 10*3/uL (ref 0.0–0.5)
Eosinophils Relative: 4 %
HCT: 30.6 % — ABNORMAL LOW (ref 36.0–46.0)
Hemoglobin: 9.8 g/dL — ABNORMAL LOW (ref 12.0–15.0)
Immature Granulocytes: 0 %
Lymphocytes Relative: 35 %
Lymphs Abs: 1 10*3/uL (ref 0.7–4.0)
MCH: 28.1 pg (ref 26.0–34.0)
MCHC: 32 g/dL (ref 30.0–36.0)
MCV: 87.7 fL (ref 80.0–100.0)
Monocytes Absolute: 0.4 10*3/uL (ref 0.1–1.0)
Monocytes Relative: 12 %
Neutro Abs: 1.4 10*3/uL — ABNORMAL LOW (ref 1.7–7.7)
Neutrophils Relative %: 48 %
Platelet Count: 223 10*3/uL (ref 150–400)
RBC: 3.49 MIL/uL — ABNORMAL LOW (ref 3.87–5.11)
RDW: 14.5 % (ref 11.5–15.5)
WBC Count: 2.9 10*3/uL — ABNORMAL LOW (ref 4.0–10.5)
nRBC: 0 % (ref 0.0–0.2)

## 2021-05-02 LAB — TSH: TSH: 1.766 u[IU]/mL (ref 0.308–3.960)

## 2021-05-02 MED ORDER — SODIUM CHLORIDE 0.9% FLUSH
10.0000 mL | INTRAVENOUS | Status: AC | PRN
  Administered 2021-05-02: 10 mL

## 2021-05-02 MED ORDER — HEPARIN SOD (PORK) LOCK FLUSH 100 UNIT/ML IV SOLN
500.0000 [IU] | Freq: Once | INTRAVENOUS | Status: AC | PRN
  Administered 2021-05-02: 500 [IU]

## 2021-05-02 MED ORDER — PALONOSETRON HCL INJECTION 0.25 MG/5ML
0.2500 mg | Freq: Once | INTRAVENOUS | Status: AC
  Administered 2021-05-02: 0.25 mg via INTRAVENOUS
  Filled 2021-05-02: qty 5

## 2021-05-02 MED ORDER — CETIRIZINE HCL 10 MG/ML IV SOLN
10.0000 mg | Freq: Once | INTRAVENOUS | Status: AC
  Administered 2021-05-02: 10 mg via INTRAVENOUS
  Filled 2021-05-02: qty 1

## 2021-05-02 MED ORDER — SODIUM CHLORIDE 0.9% FLUSH
10.0000 mL | INTRAVENOUS | Status: DC | PRN
  Administered 2021-05-02: 10 mL

## 2021-05-02 MED ORDER — SODIUM CHLORIDE 0.9 % IV SOLN: Freq: Once | INTRAVENOUS | Status: AC

## 2021-05-02 MED ORDER — SODIUM CHLORIDE 0.9 % IV SOLN
129.3000 mg | Freq: Once | INTRAVENOUS | Status: AC
  Administered 2021-05-02: 130 mg via INTRAVENOUS
  Filled 2021-05-02: qty 13

## 2021-05-02 MED ORDER — SODIUM CHLORIDE 0.9 % IV SOLN
10.0000 mg | Freq: Once | INTRAVENOUS | Status: AC
  Administered 2021-05-02: 10 mg via INTRAVENOUS
  Filled 2021-05-02: qty 10

## 2021-05-02 MED ORDER — SODIUM CHLORIDE 0.9 % IV SOLN
80.0000 mg/m2 | Freq: Once | INTRAVENOUS | Status: AC
  Administered 2021-05-02: 126 mg via INTRAVENOUS
  Filled 2021-05-02: qty 21

## 2021-05-02 MED ORDER — FAMOTIDINE IN NACL 20-0.9 MG/50ML-% IV SOLN
20.0000 mg | Freq: Once | INTRAVENOUS | Status: AC
  Administered 2021-05-02: 20 mg via INTRAVENOUS
  Filled 2021-05-02: qty 50

## 2021-05-02 NOTE — Patient Instructions (Signed)
[facility]  Discharge Instructions: Thank you for choosing [facility] to provide your oncology and hematology care.   If you have a lab appointment with the Cancer Center, please go directly to the Cancer Center and check in at the registration area.   Wear comfortable clothing and clothing appropriate for easy access to any Portacath or PICC line.   We strive to give you quality time with your provider. You may need to reschedule your appointment if you arrive late (15 or more minutes).  Arriving late affects you and other patients whose appointments are after yours.  Also, if you miss three or more appointments without notifying the office, you may be dismissed from the clinic at the provider's discretion.      For prescription refill requests, have your pharmacy contact our office and allow 72 hours for refills to be completed.    Today you received the following chemotherapy and/or immunotherapy agents: Taxol & Carboplatin   To help prevent nausea and vomiting after your treatment, we encourage you to take your nausea medication as directed.  BELOW ARE SYMPTOMS THAT SHOULD BE REPORTED IMMEDIATELY: *FEVER GREATER THAN 100.4 F (38 C) OR HIGHER *CHILLS OR SWEATING *NAUSEA AND VOMITING THAT IS NOT CONTROLLED WITH YOUR NAUSEA MEDICATION *UNUSUAL SHORTNESS OF BREATH *UNUSUAL BRUISING OR BLEEDING *URINARY PROBLEMS (pain or burning when urinating, or frequent urination) *BOWEL PROBLEMS (unusual diarrhea, constipation, pain near the anus) TENDERNESS IN MOUTH AND THROAT WITH OR WITHOUT PRESENCE OF ULCERS (sore throat, sores in mouth, or a toothache) UNUSUAL RASH, SWELLING OR PAIN  UNUSUAL VAGINAL DISCHARGE OR ITCHING   Items with * indicate a potential emergency and should be followed up as soon as possible or go to the Emergency Department if any problems should occur.  Please show the CHEMOTHERAPY ALERT CARD or IMMUNOTHERAPY ALERT CARD at  check-in to the Emergency Department and triage nurse.  Should you have questions after your visit or need to cancel or reschedule your appointment, please contact [facility]  Dept: [phone]  and follow the prompts.  Office hours are 8:00 a.m. to 4:30 p.m. Monday - Friday. Please note that voicemails left after 4:00 p.m. may not be returned until the following business day.  We are closed weekends and major holidays. You have access to a nurse at all times for urgent questions. Please call the main number to the clinic Dept: [phone] and follow the prompts.   For any non-urgent questions, you may also contact your provider using MyChart. We now offer e-Visits for anyone 18 and older to request care online for non-urgent symptoms. For details visit [email or web address].   Also download the MyChart app! Go to the app store, search "MyChart", open the app, select [facility], and log in with your MyChart username and password.  Due to Covid, a mask is required upon entering the hospital/clinic. If you do not have a mask, one will be given to you upon arrival. For doctor visits, patients may have 1 support person aged 18 or older with them. For treatment visits, patients cannot have anyone with them due to current Covid guidelines and our immunocompromised population.   

## 2021-05-02 NOTE — Progress Notes (Signed)
OK to proceed w/ trtmt today w/ ANC of 1.4 per Dr. Chryl Heck ?

## 2021-05-03 ENCOUNTER — Inpatient Hospital Stay: Payer: No Typology Code available for payment source

## 2021-05-03 ENCOUNTER — Other Ambulatory Visit: Payer: Self-pay

## 2021-05-03 VITALS — BP 110/62 | HR 87 | Temp 97.0°F | Resp 16

## 2021-05-03 DIAGNOSIS — C50412 Malignant neoplasm of upper-outer quadrant of left female breast: Secondary | ICD-10-CM

## 2021-05-03 DIAGNOSIS — Z5111 Encounter for antineoplastic chemotherapy: Secondary | ICD-10-CM | POA: Diagnosis not present

## 2021-05-03 LAB — T4: T4, Total: 6.4 ug/dL (ref 4.5–12.0)

## 2021-05-03 MED ORDER — FILGRASTIM-AAFI 300 MCG/0.5ML IJ SOSY
300.0000 ug | PREFILLED_SYRINGE | Freq: Once | INTRAMUSCULAR | Status: AC
  Administered 2021-05-03: 300 ug via SUBCUTANEOUS

## 2021-05-03 NOTE — Patient Instructions (Signed)
Filgrastim, G-CSF injection °What is this medication? °FILGRASTIM, G-CSF (fil GRA stim) is a granulocyte colony-stimulating factor that stimulates the growth of neutrophils, a type of white blood cell (WBC) important in the body's fight against infection. It is used to reduce the incidence of fever and infection in patients with certain types of cancer who are receiving chemotherapy that affects the bone marrow, to stimulate blood cell production for removal of WBCs from the body prior to a bone marrow transplantation, to reduce the incidence of fever and infection in patients who have severe chronic neutropenia, and to improve survival outcomes following high-dose radiation exposure that is toxic to the bone marrow. °This medicine may be used for other purposes; ask your health care provider or pharmacist if you have questions. °COMMON BRAND NAME(S): Neupogen, Nivestym, Releuko, Zarxio °What should I tell my care team before I take this medication? °They need to know if you have any of these conditions: °kidney disease °latex allergy °ongoing radiation therapy °sickle cell disease °an unusual or allergic reaction to filgrastim, pegfilgrastim, other medicines, foods, dyes, or preservatives °pregnant or trying to get pregnant °breast-feeding °How should I use this medication? °This medicine is for injection under the skin or infusion into a vein. As an infusion into a vein, it is usually given by a health care professional in a hospital or clinic setting. If you get this medicine at home, you will be taught how to prepare and give this medicine. Refer to the Instructions for Use that come with your medication packaging. Use exactly as directed. Take your medicine at regular intervals. Do not take your medicine more often than directed. °It is important that you put your used needles and syringes in a special sharps container. Do not put them in a trash can. If you do not have a sharps container, call your pharmacist  or healthcare provider to get one. °Talk to your pediatrician regarding the use of this medicine in children. While this drug may be prescribed for children as young as 7 months for selected conditions, precautions do apply. °Overdosage: If you think you have taken too much of this medicine contact a poison control center or emergency room at once. °NOTE: This medicine is only for you. Do not share this medicine with others. °What if I miss a dose? °It is important not to miss your dose. Call your doctor or health care professional if you miss a dose. °What may interact with this medication? °This medicine may interact with the following medications: °medicines that may cause a release of neutrophils, such as lithium °This list may not describe all possible interactions. Give your health care provider a list of all the medicines, herbs, non-prescription drugs, or dietary supplements you use. Also tell them if you smoke, drink alcohol, or use illegal drugs. Some items may interact with your medicine. °What should I watch for while using this medication? °Your condition will be monitored carefully while you are receiving this medicine. °You may need blood work done while you are taking this medicine. °Talk to your health care provider about your risk of cancer. You may be more at risk for certain types of cancer if you take this medicine. °What side effects may I notice from receiving this medication? °Side effects that you should report to your doctor or health care professional as soon as possible: °allergic reactions like skin rash, itching or hives, swelling of the face, lips, or tongue °back pain °dizziness or feeling faint °fever °pain, redness, or   irritation at site where injected °pinpoint red spots on the skin °shortness of breath or breathing problems °signs and symptoms of kidney injury like trouble passing urine, change in the amount of urine, or red or dark-brown urine °stomach or side pain, or pain at  the shoulder °swelling °tiredness °unusual bleeding or bruising °Side effects that usually do not require medical attention (report to your doctor or health care professional if they continue or are bothersome): °bone pain °cough °diarrhea °hair loss °headache °muscle pain °This list may not describe all possible side effects. Call your doctor for medical advice about side effects. You may report side effects to FDA at 1-800-FDA-1088. °Where should I keep my medication? °Keep out of the reach of children. °Store in a refrigerator between 2 and 8 degrees C (36 and 46 degrees F). Do not freeze. Keep in carton to protect from light. Throw away this medicine if vials or syringes are left out of the refrigerator for more than 24 hours. Throw away any unused medicine after the expiration date. °NOTE: This sheet is a summary. It may not cover all possible information. If you have questions about this medicine, talk to your doctor, pharmacist, or health care provider. °© 2022 Elsevier/Gold Standard (2020-10-01 00:00:00) ° °

## 2021-05-05 ENCOUNTER — Inpatient Hospital Stay: Payer: No Typology Code available for payment source

## 2021-05-05 ENCOUNTER — Other Ambulatory Visit: Payer: Self-pay

## 2021-05-05 ENCOUNTER — Encounter: Payer: Self-pay | Admitting: Hematology and Oncology

## 2021-05-05 VITALS — BP 99/66 | HR 96 | Temp 98.5°F | Resp 16

## 2021-05-05 DIAGNOSIS — Z171 Estrogen receptor negative status [ER-]: Secondary | ICD-10-CM

## 2021-05-05 DIAGNOSIS — Z5111 Encounter for antineoplastic chemotherapy: Secondary | ICD-10-CM | POA: Diagnosis not present

## 2021-05-05 MED ORDER — FILGRASTIM-AAFI 300 MCG/0.5ML IJ SOSY
300.0000 ug | PREFILLED_SYRINGE | Freq: Once | INTRAMUSCULAR | Status: AC
  Administered 2021-05-05: 300 ug via SUBCUTANEOUS
  Filled 2021-05-05: qty 0.5

## 2021-05-06 ENCOUNTER — Inpatient Hospital Stay: Payer: No Typology Code available for payment source

## 2021-05-08 ENCOUNTER — Encounter: Payer: Self-pay | Admitting: *Deleted

## 2021-05-09 ENCOUNTER — Other Ambulatory Visit: Payer: Self-pay

## 2021-05-09 ENCOUNTER — Inpatient Hospital Stay: Payer: No Typology Code available for payment source

## 2021-05-09 ENCOUNTER — Encounter: Payer: Self-pay | Admitting: Hematology and Oncology

## 2021-05-09 ENCOUNTER — Inpatient Hospital Stay (HOSPITAL_BASED_OUTPATIENT_CLINIC_OR_DEPARTMENT_OTHER): Payer: No Typology Code available for payment source | Admitting: Hematology and Oncology

## 2021-05-09 DIAGNOSIS — Z171 Estrogen receptor negative status [ER-]: Secondary | ICD-10-CM | POA: Diagnosis not present

## 2021-05-09 DIAGNOSIS — Z95828 Presence of other vascular implants and grafts: Secondary | ICD-10-CM

## 2021-05-09 DIAGNOSIS — Z5111 Encounter for antineoplastic chemotherapy: Secondary | ICD-10-CM | POA: Diagnosis not present

## 2021-05-09 DIAGNOSIS — C50412 Malignant neoplasm of upper-outer quadrant of left female breast: Secondary | ICD-10-CM | POA: Diagnosis not present

## 2021-05-09 LAB — CBC WITH DIFFERENTIAL (CANCER CENTER ONLY)
Abs Immature Granulocytes: 0.02 10*3/uL (ref 0.00–0.07)
Basophils Absolute: 0 10*3/uL (ref 0.0–0.1)
Basophils Relative: 1 %
Eosinophils Absolute: 0.2 10*3/uL (ref 0.0–0.5)
Eosinophils Relative: 5 %
HCT: 30.8 % — ABNORMAL LOW (ref 36.0–46.0)
Hemoglobin: 10.2 g/dL — ABNORMAL LOW (ref 12.0–15.0)
Immature Granulocytes: 1 %
Lymphocytes Relative: 32 %
Lymphs Abs: 1.3 10*3/uL (ref 0.7–4.0)
MCH: 28.8 pg (ref 26.0–34.0)
MCHC: 33.1 g/dL (ref 30.0–36.0)
MCV: 87 fL (ref 80.0–100.0)
Monocytes Absolute: 0.4 10*3/uL (ref 0.1–1.0)
Monocytes Relative: 10 %
Neutro Abs: 2.1 10*3/uL (ref 1.7–7.7)
Neutrophils Relative %: 51 %
Platelet Count: 178 10*3/uL (ref 150–400)
RBC: 3.54 MIL/uL — ABNORMAL LOW (ref 3.87–5.11)
RDW: 14.7 % (ref 11.5–15.5)
WBC Count: 4.1 10*3/uL (ref 4.0–10.5)
nRBC: 0 % (ref 0.0–0.2)

## 2021-05-09 LAB — CMP (CANCER CENTER ONLY)
ALT: 29 U/L (ref 0–44)
AST: 25 U/L (ref 15–41)
Albumin: 3.9 g/dL (ref 3.5–5.0)
Alkaline Phosphatase: 92 U/L (ref 38–126)
Anion gap: 4 — ABNORMAL LOW (ref 5–15)
BUN: 17 mg/dL (ref 6–20)
CO2: 30 mmol/L (ref 22–32)
Calcium: 9.2 mg/dL (ref 8.9–10.3)
Chloride: 105 mmol/L (ref 98–111)
Creatinine: 0.88 mg/dL (ref 0.44–1.00)
GFR, Estimated: >60 mL/min (ref >60)
Glucose, Bld: 112 mg/dL — ABNORMAL HIGH (ref 70–99)
Potassium: 3.7 mmol/L (ref 3.5–5.1)
Sodium: 139 mmol/L (ref 135–145)
Total Bilirubin: 0.7 mg/dL (ref 0.3–1.2)
Total Protein: 7.5 g/dL (ref 6.5–8.1)

## 2021-05-09 LAB — TSH: TSH: 1.999 u[IU]/mL (ref 0.308–3.960)

## 2021-05-09 MED ORDER — SODIUM CHLORIDE 0.9 % IV SOLN
126.1500 mg | Freq: Once | INTRAVENOUS | Status: AC
  Administered 2021-05-09: 130 mg via INTRAVENOUS
  Filled 2021-05-09: qty 13

## 2021-05-09 MED ORDER — SODIUM CHLORIDE 0.9% FLUSH
10.0000 mL | Freq: Once | INTRAVENOUS | Status: AC
  Administered 2021-05-09: 10 mL

## 2021-05-09 MED ORDER — SODIUM CHLORIDE 0.9% FLUSH
10.0000 mL | INTRAVENOUS | Status: DC | PRN
  Administered 2021-05-09: 10 mL

## 2021-05-09 MED ORDER — FAMOTIDINE IN NACL 20-0.9 MG/50ML-% IV SOLN
20.0000 mg | Freq: Once | INTRAVENOUS | Status: AC
  Administered 2021-05-09: 20 mg via INTRAVENOUS
  Filled 2021-05-09: qty 50

## 2021-05-09 MED ORDER — HEPARIN SOD (PORK) LOCK FLUSH 100 UNIT/ML IV SOLN
500.0000 [IU] | Freq: Once | INTRAVENOUS | Status: AC | PRN
  Administered 2021-05-09: 500 [IU]

## 2021-05-09 MED ORDER — PALONOSETRON HCL INJECTION 0.25 MG/5ML
0.2500 mg | Freq: Once | INTRAVENOUS | Status: AC
  Administered 2021-05-09: 0.25 mg via INTRAVENOUS
  Filled 2021-05-09: qty 5

## 2021-05-09 MED ORDER — CETIRIZINE HCL 10 MG/ML IV SOLN
10.0000 mg | Freq: Once | INTRAVENOUS | Status: AC
  Administered 2021-05-09: 10 mg via INTRAVENOUS
  Filled 2021-05-09: qty 1

## 2021-05-09 MED ORDER — SODIUM CHLORIDE 0.9 % IV SOLN
126.1500 mg | Freq: Once | INTRAVENOUS | Status: DC
  Filled 2021-05-09: qty 13

## 2021-05-09 MED ORDER — SODIUM CHLORIDE 0.9 % IV SOLN
10.0000 mg | Freq: Once | INTRAVENOUS | Status: AC
  Administered 2021-05-09: 10 mg via INTRAVENOUS
  Filled 2021-05-09: qty 10

## 2021-05-09 MED ORDER — SODIUM CHLORIDE 0.9 % IV SOLN
80.0000 mg/m2 | Freq: Once | INTRAVENOUS | Status: AC
  Administered 2021-05-09: 126 mg via INTRAVENOUS
  Filled 2021-05-09: qty 21

## 2021-05-09 MED ORDER — SODIUM CHLORIDE 0.9 % IV SOLN
200.0000 mg | Freq: Once | INTRAVENOUS | Status: AC
  Administered 2021-05-09: 200 mg via INTRAVENOUS
  Filled 2021-05-09: qty 200

## 2021-05-09 MED ORDER — SODIUM CHLORIDE 0.9 % IV SOLN: Freq: Once | INTRAVENOUS | Status: AC

## 2021-05-09 NOTE — Assessment & Plan Note (Addendum)
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 ? 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 ? ? ?Treatment Plan:  ?1. Neoadjuvant chemotherapy with pembrolizumab, carboplatin, Taxol followed by pembrolizumab, Adriamycin, Cytoxan. ?2. Followed by breast conserving surgery with sentinel lymph node study ?3. Followed by adjuvant radiation therapy ?No role for antiestrogen therapy since this is a triple negative breast cancer ?4. Adjuvant immunotherapy for 9 cycles. ? ?She is tolerating carbotaxol/keytruda well. No major adverse effects reported. ?Labs today satisfactory to proceed with chemoimmunotherapy.   ?Physical examination, tumor slightly smaller compared to last visit consistent with response. The tumor continues to be softer, consistent with necrosis. Today the tumor measured about 2.5 cms in horizontal dimension and just about 3 cms in vertical dimension, or may be slightly larger than 3 cms. ? ?She will proceed with chemotherapy as scheduled and will return to clinic for next cycle of chemoimmunotherapy. ?CBC today appears satisfactory, ok to proceed with chemotherapy if labs are satisfactory. ?

## 2021-05-09 NOTE — Progress Notes (Signed)
[facility]: ?  ? ?[place] [place] ?[address], [address] ?[place] [address] ? ? ?DIAGNOSIS:  Cancer Staging  ?Malignant neoplasm of upper-outer quadrant of left breast in female, estrogen receptor negative ([facility]) ?Staging form: Breast, AJCC 8th Edition ?- Clinical stage from 03/07/2021: Stage IIB (cT2, cN0, cM0, G3, ER-, PR-, HER2-) - Signed by Nicholas Lose, MD on 03/07/2021 ?Stage prefix: Initial diagnosis ?Histologic grading system: 3 grade system ? ? ?SUMMARY OF ONCOLOGIC HISTORY: ?Oncology History  ?Malignant neoplasm of upper-outer quadrant of left breast in female, estrogen receptor negative ([facility])  ?02/12/2021 Initial Diagnosis  ? palpable solid mass in the left upper outer quadrant. Diagnostic mammogram on 02/12/2021 showed a 3 cm mass in the left upper outer quadrant. Biopsy on 02/20/2021 showed grade 3 invasive ductal carcinoma, ER/PR/Her2-([facility]) ?  ?03/07/2021 Cancer Staging  ? Staging form: Breast, AJCC 8th Edition ?- Clinical stage from 03/07/2021: Stage IIB (cT2, cN0, cM0, G3, ER-, PR-, HER2-) - Signed by Nicholas Lose, MD on 03/07/2021 ?Stage prefix: Initial diagnosis ?Histologic grading system: 3 grade system ? ?  ?03/28/2021 -  Chemotherapy  ? Patient is on Treatment Plan : BREAST Pembrolizumab (200) D1 + Carboplatin (1.5) D1,8,15 + Paclitaxel (80) D1,8,15 q21d X 4 cycles / Pembrolizumab (200) D1 + AC D1 q21d x 4 cycles  ?   ? ? ?CURRENT THERAPY: Taxol, Carbo, Pembrolizumab ? ?INTERVAL HISTORY: ? ?Amanda Smith 56 y.o. female returns for evaluation prior to receiving her next cycle of neoadjuvant chemotherapy.   ?Baseline ECHO March 21, 2021 and showed a normal left ventricular ejection fraction of 60 to 65%. ?She is doing very well so far. She says she is overwhelmed mentally, but trying her best to cope with everything. ?She had itching which she thinks is related to something she ate. No nausea, vomiting, diarrhea, fevers or chills. ?No  neuropathy. ? ?Rest of the pertinent 10 point ROS reviewed and negative. ? ?Patient Active Problem List  ? Diagnosis Date Noted  ? Port-A-Cath in place 05/09/2021  ? Malignant neoplasm of upper-outer quadrant of left breast in female, estrogen receptor negative ([facility]) 03/06/2021  ? ? ?is allergic to benadryl [diphenhydramine]. ? ?MEDICAL HISTORY: ?Past Medical History:  ?Diagnosis Date  ? Breast cancer ([facility])   ? ? ?SURGICAL HISTORY: ?Past Surgical History:  ?Procedure Laterality Date  ? CESAREAN SECTION    ? PORTACATH PLACEMENT Right 03/18/2021  ? Procedure: INSERTION PORT-A-CATH;  Surgeon: Rolm Bookbinder, MD;  Location: [facility];  Service: General;  Laterality: Right;  ? WRIST FRACTURE SURGERY    ? ? ?SOCIAL HISTORY: ?Social History  ? ?Socioeconomic History  ? Marital status: Married  ?  Spouse name: Not on file  ? Number of children: Not on file  ? Years of education: Not on file  ? Highest education level: Not on file  ?Occupational History  ? Not on file  ?Tobacco Use  ? Smoking status: Never  ? Smokeless tobacco: Never  ?Vaping Use  ? Vaping Use: Never used  ?Substance and Sexual Activity  ? Alcohol use: Never  ? Drug use: Never  ? Sexual activity: Yes  ?  Birth control/protection: Post-menopausal  ?Other Topics Concern  ? Not on file  ?Social History Narrative  ? Not on file  ? ?Social Determinants of Health  ? ?Financial Resource Strain: Not on file  ?Food Insecurity: Not on file  ?Transportation Needs: Not on file  ?Physical Activity: Not on  file  ?Stress: Not on file  ?Social Connections: Not on file  ?Intimate Partner Violence: Not on file  ? ? ?FAMILY HISTORY: ?Family History  ?Problem Relation Age of Onset  ? Breast cancer Sister   ? ? ?Review of Systems  ?Constitutional:  Negative for appetite change, chills, fatigue, fever and unexpected weight change.  ?HENT:   Negative for hearing loss, lump/mass and trouble swallowing.   ?Eyes:  Negative for eye problems and icterus.   ?Respiratory:  Negative for chest tightness, cough and shortness of breath.   ?Cardiovascular:  Negative for chest pain, leg swelling and palpitations.  ?Gastrointestinal:  Negative for abdominal distention, abdominal pain, constipation, diarrhea, nausea and vomiting.  ?Endocrine: Negative for hot flashes.  ?Genitourinary:  Negative for difficulty urinating.   ?Musculoskeletal:  Negative for arthralgias.  ?Skin:  Positive for itching. Negative for rash.  ?Neurological:  Negative for dizziness, extremity weakness, headaches and numbness.  ?Hematological:  Negative for adenopathy. Does not bruise/bleed easily.  ?Psychiatric/Behavioral:  Negative for depression. The patient is not nervous/anxious.    ? ? ?PHYSICAL EXAMINATION ? ?ECOG PERFORMANCE STATUS: 1 - Symptomatic but completely ambulatory ? ?Vitals:  ? 05/09/21 0855  ?BP: (!) 99/59  ?Pulse: 94  ?Resp: 20  ?Temp: 97.7 ?F (36.5 ?C)  ?SpO2: 100%  ? ? ? ?Physical Exam ?Constitutional:   ?   General: She is not in acute distress. ?   Appearance: Normal appearance. She is not toxic-appearing.  ?HENT:  ?   Head: Normocephalic and atraumatic.  ?Eyes:  ?   General: No scleral icterus. ?Cardiovascular:  ?   Rate and Rhythm: Normal rate and regular rhythm.  ?   Pulses: Normal pulses.  ?   Heart sounds: Normal heart sounds.  ?Pulmonary:  ?   Effort: Pulmonary effort is normal.  ?   Breath sounds: Normal breath sounds.  ?Chest:  ?   Comments: Left breast with UOQ mass horizontal dimension is under 3 cms, vertical dimension is under 4 cms. NO palpable axillary lymphadenopathy ?Abdominal:  ?   General: Abdomen is flat. Bowel sounds are normal. There is no distension.  ?   Palpations: Abdomen is soft.  ?   Tenderness: There is no abdominal tenderness.  ?Musculoskeletal:     ?   General: No swelling.  ?   Cervical back: Neck supple.  ?Lymphadenopathy:  ?   Cervical: No cervical adenopathy.  ?Skin: ?   General: Skin is warm and dry.  ?   Findings: No rash.  ?Neurological:  ?    General: No focal deficit present.  ?   Mental Status: She is alert.  ?Psychiatric:     ?   Mood and Affect: Mood normal.     ?   Behavior: Behavior normal.  ? ? ?LABORATORY DATA: ? ?CBC ?   ?Component Value Date/Time  ? WBC 4.1 05/09/2021 0843  ? WBC 5.7 11/23/2006 0325  ? RBC 3.54 (L) 05/09/2021 0843  ? HGB 10.2 (L) 05/09/2021 0843  ? HCT 30.8 (L) 05/09/2021 0843  ? PLT 178 05/09/2021 0843  ? MCV 87.0 05/09/2021 0843  ? MCH 28.8 05/09/2021 0843  ? MCHC 33.1 05/09/2021 0843  ? RDW 14.7 05/09/2021 0843  ? LYMPHSABS 1.3 05/09/2021 0843  ? MONOABS 0.4 05/09/2021 0843  ? EOSABS 0.2 05/09/2021 0843  ? BASOSABS 0.0 05/09/2021 0843  ? ? ?CMP  ?   ?Component Value Date/Time  ? NA 141 05/02/2021 0925  ? K 3.8 05/02/2021 0925  ?  CL 105 05/02/2021 0925  ? CO2 29 05/02/2021 0925  ? GLUCOSE 90 05/02/2021 0925  ? BUN 23 (H) 05/02/2021 0925  ? CREATININE 0.85 05/02/2021 0925  ? CALCIUM 9.2 05/02/2021 0925  ? PROT 7.7 05/02/2021 0925  ? ALBUMIN 3.9 05/02/2021 0925  ? AST 30 05/02/2021 0925  ? ALT 29 05/02/2021 0925  ? ALKPHOS 80 05/02/2021 0925  ? BILITOT 0.8 05/02/2021 0925  ? GFRNONAA >60 05/02/2021 0925  ? ? ? ? ?ASSESSMENT and THERAPY PLAN:  ? ?Malignant neoplasm of upper-outer quadrant of left breast in female, estrogen receptor negative ([facility]) ?Linna is a 56 year old woman with 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 ? 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 ? ? ?Treatment Plan:  ?1. Neoadjuvant chemotherapy with pembrolizumab, carboplatin, Taxol followed by pembrolizumab, Adriamycin, Cytoxan. ?2. Followed by breast conserving surgery with sentinel lymph node study ?3. Followed by adjuvant radiation therapy ?No role for antiestrogen therapy since this is a triple negative breast cancer ?4. Adjuvant immunotherapy for 9 cycles. ? ?She is tolerating carbotaxol/keytruda well. No major  adverse effects reported. ?Labs today satisfactory to proceed with chemoimmunotherapy.   ?Physical examination, tumor slightly smaller compared to last visit consistent with response.  Currently measures 3 x 2.5 cm, l

## 2021-05-09 NOTE — Patient Instructions (Signed)
[facility]  Discharge Instructions: ?Thank you for choosing [facility] to provide your oncology and hematology care.  ? ?If you have a lab appointment with the [facility], please go directly to the [facility] and check in at the registration area. ?  ?Wear comfortable clothing and clothing appropriate for easy access to any Portacath or PICC line.  ? ?We strive to give you quality time with your provider. You may need to reschedule your appointment if you arrive late (15 or more minutes).  Arriving late affects you and other patients whose appointments are after yours.  Also, if you miss three or more appointments without notifying the office, you may be dismissed from the clinic at the provider?s discretion.    ?  ?For prescription refill requests, have your pharmacy contact our office and allow 72 hours for refills to be completed.   ? ?Today you received the following chemotherapy and/or immunotherapy agents: Keytruda, Carboplatin, taxol    ?  ?To help prevent nausea and vomiting after your treatment, we encourage you to take your nausea medication as directed. ? ?BELOW ARE SYMPTOMS THAT SHOULD BE REPORTED IMMEDIATELY: ?*FEVER GREATER THAN 100.4 F (38 ?C) OR HIGHER ?*CHILLS OR SWEATING ?*NAUSEA AND VOMITING THAT IS NOT CONTROLLED WITH YOUR NAUSEA MEDICATION ?*UNUSUAL SHORTNESS OF BREATH ?*UNUSUAL BRUISING OR BLEEDING ?*URINARY PROBLEMS (pain or burning when urinating, or frequent urination) ?*BOWEL PROBLEMS (unusual diarrhea, constipation, pain near the anus) ?TENDERNESS IN MOUTH AND THROAT WITH OR WITHOUT PRESENCE OF ULCERS (sore throat, sores in mouth, or a toothache) ?UNUSUAL RASH, SWELLING OR PAIN  ?UNUSUAL VAGINAL DISCHARGE OR ITCHING  ? ?Items with * indicate a potential emergency and should be followed up as soon as possible or go to the Emergency Department if any problems should occur. ? ?Please show the CHEMOTHERAPY ALERT CARD or IMMUNOTHERAPY ALERT  CARD at check-in to the Emergency Department and triage nurse. ? ?Should you have questions after your visit or need to cancel or reschedule your appointment, please contact [facility]  Dept: [phone]  and follow the prompts.  Office hours are 8:00 a.m. to 4:30 p.m. Monday - Friday. Please note that voicemails left after 4:00 p.m. may not be returned until the following business day.  We are closed weekends and major holidays. You have access to a nurse at all times for urgent questions. Please call the main number to the clinic Dept: [phone] and follow the prompts. ? ? ?For any non-urgent questions, you may also contact your provider using MyChart. We now offer e-Visits for anyone 65 and older to request care online for non-urgent symptoms. For details visit [email or web address]. ?  ?Also download the MyChart app! Go to the app store, search "MyChart", open the app, select [facility], and log in with your MyChart username and password. ? ?Due to Covid, a mask is required upon entering the hospital/clinic. If you do not have a mask, one will be given to you upon arrival. For doctor visits, patients may have 1 support person aged 34 or older with them. For treatment visits, patients cannot have anyone with them due to current Covid guidelines and our immunocompromised population.  ? ?

## 2021-05-10 LAB — T4: T4, Total: 6.3 ug/dL (ref 4.5–12.0)

## 2021-05-12 ENCOUNTER — Encounter: Payer: Self-pay | Admitting: *Deleted

## 2021-05-16 ENCOUNTER — Other Ambulatory Visit: Payer: Self-pay

## 2021-05-16 ENCOUNTER — Inpatient Hospital Stay: Payer: No Typology Code available for payment source

## 2021-05-16 VITALS — BP 93/58 | HR 86 | Temp 97.8°F | Resp 16 | Wt 113.2 lb

## 2021-05-16 DIAGNOSIS — Z95828 Presence of other vascular implants and grafts: Secondary | ICD-10-CM

## 2021-05-16 DIAGNOSIS — Z171 Estrogen receptor negative status [ER-]: Secondary | ICD-10-CM

## 2021-05-16 DIAGNOSIS — Z5111 Encounter for antineoplastic chemotherapy: Secondary | ICD-10-CM | POA: Diagnosis not present

## 2021-05-16 LAB — CMP (CANCER CENTER ONLY)
ALT: 28 U/L (ref 0–44)
AST: 28 U/L (ref 15–41)
Albumin: 3.8 g/dL (ref 3.5–5.0)
Alkaline Phosphatase: 84 U/L (ref 38–126)
Anion gap: 8 (ref 5–15)
BUN: 19 mg/dL (ref 6–20)
CO2: 29 mmol/L (ref 22–32)
Calcium: 9.4 mg/dL (ref 8.9–10.3)
Chloride: 102 mmol/L (ref 98–111)
Creatinine: 0.89 mg/dL (ref 0.44–1.00)
GFR, Estimated: >60 mL/min (ref >60)
Glucose, Bld: 126 mg/dL — ABNORMAL HIGH (ref 70–99)
Potassium: 4 mmol/L (ref 3.5–5.1)
Sodium: 139 mmol/L (ref 135–145)
Total Bilirubin: 0.7 mg/dL (ref 0.3–1.2)
Total Protein: 7.5 g/dL (ref 6.5–8.1)

## 2021-05-16 LAB — CBC WITH DIFFERENTIAL (CANCER CENTER ONLY)
Abs Immature Granulocytes: 0.02 10*3/uL (ref 0.00–0.07)
Basophils Absolute: 0 10*3/uL (ref 0.0–0.1)
Basophils Relative: 1 %
Eosinophils Absolute: 0.1 10*3/uL (ref 0.0–0.5)
Eosinophils Relative: 5 %
HCT: 30.1 % — ABNORMAL LOW (ref 36.0–46.0)
Hemoglobin: 9.8 g/dL — ABNORMAL LOW (ref 12.0–15.0)
Immature Granulocytes: 1 %
Lymphocytes Relative: 35 %
Lymphs Abs: 1 10*3/uL (ref 0.7–4.0)
MCH: 28.4 pg (ref 26.0–34.0)
MCHC: 32.6 g/dL (ref 30.0–36.0)
MCV: 87.2 fL (ref 80.0–100.0)
Monocytes Absolute: 0.1 10*3/uL (ref 0.1–1.0)
Monocytes Relative: 5 %
Neutro Abs: 1.6 10*3/uL — ABNORMAL LOW (ref 1.7–7.7)
Neutrophils Relative %: 53 %
Platelet Count: 172 10*3/uL (ref 150–400)
RBC: 3.45 MIL/uL — ABNORMAL LOW (ref 3.87–5.11)
RDW: 14.6 % (ref 11.5–15.5)
WBC Count: 2.9 10*3/uL — ABNORMAL LOW (ref 4.0–10.5)
nRBC: 0 % (ref 0.0–0.2)

## 2021-05-16 LAB — TSH: TSH: 2.006 u[IU]/mL (ref 0.350–4.500)

## 2021-05-16 MED ORDER — SODIUM CHLORIDE 0.9% FLUSH
10.0000 mL | INTRAVENOUS | Status: DC | PRN
  Administered 2021-05-16: 10 mL

## 2021-05-16 MED ORDER — CETIRIZINE HCL 10 MG/ML IV SOLN
10.0000 mg | Freq: Once | INTRAVENOUS | Status: AC
  Administered 2021-05-16: 10 mg via INTRAVENOUS
  Filled 2021-05-16: qty 1

## 2021-05-16 MED ORDER — PALONOSETRON HCL INJECTION 0.25 MG/5ML
0.2500 mg | Freq: Once | INTRAVENOUS | Status: AC
  Administered 2021-05-16: 0.25 mg via INTRAVENOUS
  Filled 2021-05-16: qty 5

## 2021-05-16 MED ORDER — HEPARIN SOD (PORK) LOCK FLUSH 100 UNIT/ML IV SOLN
500.0000 [IU] | Freq: Once | INTRAVENOUS | Status: AC | PRN
  Administered 2021-05-16: 500 [IU]

## 2021-05-16 MED ORDER — FAMOTIDINE IN NACL 20-0.9 MG/50ML-% IV SOLN
20.0000 mg | Freq: Once | INTRAVENOUS | Status: AC
  Administered 2021-05-16: 20 mg via INTRAVENOUS
  Filled 2021-05-16: qty 50

## 2021-05-16 MED ORDER — SODIUM CHLORIDE 0.9 % IV SOLN
130.0000 mg | Freq: Once | INTRAVENOUS | Status: AC
  Administered 2021-05-16: 130 mg via INTRAVENOUS
  Filled 2021-05-16: qty 13

## 2021-05-16 MED ORDER — SODIUM CHLORIDE 0.9 % IV SOLN
10.0000 mg | Freq: Once | INTRAVENOUS | Status: AC
  Administered 2021-05-16: 10 mg via INTRAVENOUS
  Filled 2021-05-16: qty 10

## 2021-05-16 MED ORDER — SODIUM CHLORIDE 0.9 % IV SOLN: Freq: Once | INTRAVENOUS | Status: AC

## 2021-05-16 MED ORDER — SODIUM CHLORIDE 0.9 % IV SOLN
80.0000 mg/m2 | Freq: Once | INTRAVENOUS | Status: AC
  Administered 2021-05-16: 126 mg via INTRAVENOUS
  Filled 2021-05-16: qty 21

## 2021-05-16 MED ORDER — SODIUM CHLORIDE 0.9% FLUSH
10.0000 mL | Freq: Once | INTRAVENOUS | Status: AC
  Administered 2021-05-16: 10 mL

## 2021-05-16 NOTE — Patient Instructions (Signed)
[facility]  Discharge Instructions: Thank you for choosing [facility] to provide your oncology and hematology care.   If you have a lab appointment with the Cancer Center, please go directly to the Cancer Center and check in at the registration area.   Wear comfortable clothing and clothing appropriate for easy access to any Portacath or PICC line.   We strive to give you quality time with your provider. You may need to reschedule your appointment if you arrive late (15 or more minutes).  Arriving late affects you and other patients whose appointments are after yours.  Also, if you miss three or more appointments without notifying the office, you may be dismissed from the clinic at the provider's discretion.      For prescription refill requests, have your pharmacy contact our office and allow 72 hours for refills to be completed.    Today you received the following chemotherapy and/or immunotherapy agents: Taxol & Carboplatin   To help prevent nausea and vomiting after your treatment, we encourage you to take your nausea medication as directed.  BELOW ARE SYMPTOMS THAT SHOULD BE REPORTED IMMEDIATELY: *FEVER GREATER THAN 100.4 F (38 C) OR HIGHER *CHILLS OR SWEATING *NAUSEA AND VOMITING THAT IS NOT CONTROLLED WITH YOUR NAUSEA MEDICATION *UNUSUAL SHORTNESS OF BREATH *UNUSUAL BRUISING OR BLEEDING *URINARY PROBLEMS (pain or burning when urinating, or frequent urination) *BOWEL PROBLEMS (unusual diarrhea, constipation, pain near the anus) TENDERNESS IN MOUTH AND THROAT WITH OR WITHOUT PRESENCE OF ULCERS (sore throat, sores in mouth, or a toothache) UNUSUAL RASH, SWELLING OR PAIN  UNUSUAL VAGINAL DISCHARGE OR ITCHING   Items with * indicate a potential emergency and should be followed up as soon as possible or go to the Emergency Department if any problems should occur.  Please show the CHEMOTHERAPY ALERT CARD or IMMUNOTHERAPY ALERT CARD at  check-in to the Emergency Department and triage nurse.  Should you have questions after your visit or need to cancel or reschedule your appointment, please contact [facility]  Dept: [phone]  and follow the prompts.  Office hours are 8:00 a.m. to 4:30 p.m. Monday - Friday. Please note that voicemails left after 4:00 p.m. may not be returned until the following business day.  We are closed weekends and major holidays. You have access to a nurse at all times for urgent questions. Please call the main number to the clinic Dept: [phone] and follow the prompts.   For any non-urgent questions, you may also contact your provider using MyChart. We now offer e-Visits for anyone 18 and older to request care online for non-urgent symptoms. For details visit [email or web address].   Also download the MyChart app! Go to the app store, search "MyChart", open the app, select [facility], and log in with your MyChart username and password.  Due to Covid, a mask is required upon entering the hospital/clinic. If you do not have a mask, one will be given to you upon arrival. For doctor visits, patients may have 1 support person aged 18 or older with them. For treatment visits, patients cannot have anyone with them due to current Covid guidelines and our immunocompromised population.   

## 2021-05-17 LAB — T4: T4, Total: 6.1 ug/dL (ref 4.5–12.0)

## 2021-05-23 ENCOUNTER — Inpatient Hospital Stay: Payer: No Typology Code available for payment source

## 2021-05-23 ENCOUNTER — Other Ambulatory Visit: Payer: Self-pay

## 2021-05-23 VITALS — BP 87/75 | HR 95 | Temp 98.2°F | Resp 16 | Wt 114.8 lb

## 2021-05-23 DIAGNOSIS — Z5111 Encounter for antineoplastic chemotherapy: Secondary | ICD-10-CM | POA: Diagnosis not present

## 2021-05-23 DIAGNOSIS — C50412 Malignant neoplasm of upper-outer quadrant of left female breast: Secondary | ICD-10-CM

## 2021-05-23 DIAGNOSIS — Z95828 Presence of other vascular implants and grafts: Secondary | ICD-10-CM

## 2021-05-23 LAB — CMP (CANCER CENTER ONLY)
ALT: 23 U/L (ref 0–44)
AST: 27 U/L (ref 15–41)
Albumin: 3.8 g/dL (ref 3.5–5.0)
Alkaline Phosphatase: 67 U/L (ref 38–126)
Anion gap: 4 — ABNORMAL LOW (ref 5–15)
BUN: 21 mg/dL — ABNORMAL HIGH (ref 6–20)
CO2: 29 mmol/L (ref 22–32)
Calcium: 9.2 mg/dL (ref 8.9–10.3)
Chloride: 106 mmol/L (ref 98–111)
Creatinine: 0.81 mg/dL (ref 0.44–1.00)
GFR, Estimated: >60 mL/min (ref >60)
Glucose, Bld: 110 mg/dL — ABNORMAL HIGH (ref 70–99)
Potassium: 3.6 mmol/L (ref 3.5–5.1)
Sodium: 139 mmol/L (ref 135–145)
Total Bilirubin: 0.9 mg/dL (ref 0.3–1.2)
Total Protein: 7.1 g/dL (ref 6.5–8.1)

## 2021-05-23 LAB — CBC WITH DIFFERENTIAL (CANCER CENTER ONLY)
Abs Immature Granulocytes: 0.01 10*3/uL (ref 0.00–0.07)
Basophils Absolute: 0 10*3/uL (ref 0.0–0.1)
Basophils Relative: 1 %
Eosinophils Absolute: 0.1 10*3/uL (ref 0.0–0.5)
Eosinophils Relative: 4 %
HCT: 28 % — ABNORMAL LOW (ref 36.0–46.0)
Hemoglobin: 9.1 g/dL — ABNORMAL LOW (ref 12.0–15.0)
Immature Granulocytes: 1 %
Lymphocytes Relative: 43 %
Lymphs Abs: 0.9 10*3/uL (ref 0.7–4.0)
MCH: 28.4 pg (ref 26.0–34.0)
MCHC: 32.5 g/dL (ref 30.0–36.0)
MCV: 87.5 fL (ref 80.0–100.0)
Monocytes Absolute: 0.1 10*3/uL (ref 0.1–1.0)
Monocytes Relative: 7 %
Neutro Abs: 0.9 10*3/uL — ABNORMAL LOW (ref 1.7–7.7)
Neutrophils Relative %: 44 %
Platelet Count: 186 10*3/uL (ref 150–400)
RBC: 3.2 MIL/uL — ABNORMAL LOW (ref 3.87–5.11)
RDW: 15.3 % (ref 11.5–15.5)
WBC Count: 2 10*3/uL — ABNORMAL LOW (ref 4.0–10.5)
nRBC: 0 % (ref 0.0–0.2)

## 2021-05-23 LAB — TSH: TSH: 1.857 u[IU]/mL (ref 0.350–4.500)

## 2021-05-23 MED ORDER — SODIUM CHLORIDE 0.9 % IV SOLN
10.0000 mg | Freq: Once | INTRAVENOUS | Status: AC
  Administered 2021-05-23: 10 mg via INTRAVENOUS
  Filled 2021-05-23: qty 10

## 2021-05-23 MED ORDER — SODIUM CHLORIDE 0.9% FLUSH
10.0000 mL | Freq: Once | INTRAVENOUS | Status: AC
  Administered 2021-05-23: 10 mL

## 2021-05-23 MED ORDER — HEPARIN SOD (PORK) LOCK FLUSH 100 UNIT/ML IV SOLN
500.0000 [IU] | Freq: Once | INTRAVENOUS | Status: AC | PRN
  Administered 2021-05-23: 500 [IU]

## 2021-05-23 MED ORDER — SODIUM CHLORIDE 0.9 % IV SOLN
80.0000 mg/m2 | Freq: Once | INTRAVENOUS | Status: AC
  Administered 2021-05-23: 126 mg via INTRAVENOUS
  Filled 2021-05-23: qty 21

## 2021-05-23 MED ORDER — SODIUM CHLORIDE 0.9 % IV SOLN
133.8000 mg | Freq: Once | INTRAVENOUS | Status: AC
  Administered 2021-05-23: 130 mg via INTRAVENOUS
  Filled 2021-05-23: qty 13

## 2021-05-23 MED ORDER — SODIUM CHLORIDE 0.9% FLUSH
10.0000 mL | INTRAVENOUS | Status: DC | PRN
  Administered 2021-05-23: 10 mL

## 2021-05-23 MED ORDER — SODIUM CHLORIDE 0.9 % IV SOLN: Freq: Once | INTRAVENOUS | Status: DC

## 2021-05-23 MED ORDER — PALONOSETRON HCL INJECTION 0.25 MG/5ML
0.2500 mg | Freq: Once | INTRAVENOUS | Status: AC
  Administered 2021-05-23: 0.25 mg via INTRAVENOUS
  Filled 2021-05-23: qty 5

## 2021-05-23 MED ORDER — CETIRIZINE HCL 10 MG/ML IV SOLN
10.0000 mg | Freq: Once | INTRAVENOUS | Status: AC
  Administered 2021-05-23: 10 mg via INTRAVENOUS
  Filled 2021-05-23: qty 1

## 2021-05-23 MED ORDER — FAMOTIDINE IN NACL 20-0.9 MG/50ML-% IV SOLN
20.0000 mg | Freq: Once | INTRAVENOUS | Status: AC
  Administered 2021-05-23: 20 mg via INTRAVENOUS
  Filled 2021-05-23: qty 50

## 2021-05-23 NOTE — Progress Notes (Signed)
Ok to treat with ANC of 0.9 per Dr Chryl Heck. Patient receiving GF tomorrow ?

## 2021-05-24 ENCOUNTER — Inpatient Hospital Stay: Payer: No Typology Code available for payment source

## 2021-05-24 VITALS — BP 112/62 | HR 97 | Temp 97.7°F | Resp 15 | Ht 62.0 in

## 2021-05-24 DIAGNOSIS — Z5111 Encounter for antineoplastic chemotherapy: Secondary | ICD-10-CM | POA: Diagnosis not present

## 2021-05-24 DIAGNOSIS — C50412 Malignant neoplasm of upper-outer quadrant of left female breast: Secondary | ICD-10-CM

## 2021-05-24 LAB — T4: T4, Total: 6.2 ug/dL (ref 4.5–12.0)

## 2021-05-24 MED ORDER — FILGRASTIM-AAFI 300 MCG/0.5ML IJ SOSY
300.0000 ug | PREFILLED_SYRINGE | Freq: Once | INTRAMUSCULAR | Status: AC
  Administered 2021-05-24: 300 ug via SUBCUTANEOUS
  Filled 2021-05-24: qty 0.5

## 2021-05-24 NOTE — Patient Instructions (Signed)
Filgrastim, G-CSF injection ?What is this medication? ?FILGRASTIM, G-CSF (fil GRA stim) is a granulocyte colony-stimulating factor that stimulates the growth of neutrophils, a type of white blood cell (WBC) important in the body's fight against infection. It is used to reduce the incidence of fever and infection in patients with certain types of cancer who are receiving chemotherapy that affects the bone marrow, to stimulate blood cell production for removal of WBCs from the body prior to a bone marrow transplantation, to reduce the incidence of fever and infection in patients who have severe chronic neutropenia, and to improve survival outcomes following high-dose radiation exposure that is toxic to the bone marrow. ?This medicine may be used for other purposes; ask your health care provider or pharmacist if you have questions. ?COMMON BRAND NAME(S): Neupogen, Nivestym, Releuko, Zarxio ?What should I tell my care team before I take this medication? ?They need to know if you have any of these conditions: ?kidney disease ?latex allergy ?ongoing radiation therapy ?sickle cell disease ?an unusual or allergic reaction to filgrastim, pegfilgrastim, other medicines, foods, dyes, or preservatives ?pregnant or trying to get pregnant ?breast-feeding ?How should I use this medication? ?This medicine is for injection under the skin or infusion into a vein. As an infusion into a vein, it is usually given by a health care professional in a hospital or clinic setting. If you get this medicine at home, you will be taught how to prepare and give this medicine. Refer to the Instructions for Use that come with your medication packaging. Use exactly as directed. Take your medicine at regular intervals. Do not take your medicine more often than directed. ?It is important that you put your used needles and syringes in a special sharps container. Do not put them in a trash can. If you do not have a sharps container, call your pharmacist  or healthcare provider to get one. ?Talk to your pediatrician regarding the use of this medicine in children. While this drug may be prescribed for children as young as 7 months for selected conditions, precautions do apply. ?Overdosage: If you think you have taken too much of this medicine contact a poison control center or emergency room at once. ?NOTE: This medicine is only for you. Do not share this medicine with others. ?What if I miss a dose? ?It is important not to miss your dose. Call your doctor or health care professional if you miss a dose. ?What may interact with this medication? ?This medicine may interact with the following medications: ?medicines that may cause a release of neutrophils, such as lithium ?This list may not describe all possible interactions. Give your health care provider a list of all the medicines, herbs, non-prescription drugs, or dietary supplements you use. Also tell them if you smoke, drink alcohol, or use illegal drugs. Some items may interact with your medicine. ?What should I watch for while using this medication? ?Your condition will be monitored carefully while you are receiving this medicine. ?You may need blood work done while you are taking this medicine. ?Talk to your health care provider about your risk of cancer. You may be more at risk for certain types of cancer if you take this medicine. ?What side effects may I notice from receiving this medication? ?Side effects that you should report to your doctor or health care professional as soon as possible: ?allergic reactions like skin rash, itching or hives, swelling of the face, lips, or tongue ?back pain ?dizziness or feeling faint ?fever ?pain, redness, or   irritation at site where injected ?pinpoint red spots on the skin ?shortness of breath or breathing problems ?signs and symptoms of kidney injury like trouble passing urine, change in the amount of urine, or red or dark-brown urine ?stomach or side pain, or pain at  the shoulder ?swelling ?tiredness ?unusual bleeding or bruising ?Side effects that usually do not require medical attention (report to your doctor or health care professional if they continue or are bothersome): ?bone pain ?cough ?diarrhea ?hair loss ?headache ?muscle pain ?This list may not describe all possible side effects. Call your doctor for medical advice about side effects. You may report side effects to FDA at 1-800-FDA-1088. ?Where should I keep my medication? ?Keep out of the reach of children. ?Store in a refrigerator between 2 and 8 degrees C (36 and 46 degrees F). Do not freeze. Keep in carton to protect from light. Throw away this medicine if vials or syringes are left out of the refrigerator for more than 24 hours. Throw away any unused medicine after the expiration date. ?NOTE: This sheet is a summary. It may not cover all possible information. If you have questions about this medicine, talk to your doctor, pharmacist, or health care provider. ?? 2023 Elsevier/Gold Standard (2020-12-13 00:00:00) ? ?

## 2021-05-26 ENCOUNTER — Inpatient Hospital Stay: Payer: No Typology Code available for payment source | Attending: Hematology and Oncology

## 2021-05-26 ENCOUNTER — Other Ambulatory Visit: Payer: Self-pay

## 2021-05-26 VITALS — BP 99/58 | HR 87 | Temp 98.7°F | Resp 16

## 2021-05-26 DIAGNOSIS — Z5112 Encounter for antineoplastic immunotherapy: Secondary | ICD-10-CM | POA: Diagnosis not present

## 2021-05-26 DIAGNOSIS — Z803 Family history of malignant neoplasm of breast: Secondary | ICD-10-CM | POA: Diagnosis not present

## 2021-05-26 DIAGNOSIS — Z5111 Encounter for antineoplastic chemotherapy: Secondary | ICD-10-CM | POA: Insufficient documentation

## 2021-05-26 DIAGNOSIS — Z9221 Personal history of antineoplastic chemotherapy: Secondary | ICD-10-CM | POA: Diagnosis not present

## 2021-05-26 DIAGNOSIS — C50412 Malignant neoplasm of upper-outer quadrant of left female breast: Secondary | ICD-10-CM | POA: Diagnosis not present

## 2021-05-26 DIAGNOSIS — L299 Pruritus, unspecified: Secondary | ICD-10-CM | POA: Insufficient documentation

## 2021-05-26 MED ORDER — FILGRASTIM-AAFI 300 MCG/0.5ML IJ SOSY
300.0000 ug | PREFILLED_SYRINGE | Freq: Once | INTRAMUSCULAR | Status: AC
  Administered 2021-05-26: 300 ug via SUBCUTANEOUS
  Filled 2021-05-26: qty 0.5

## 2021-05-26 NOTE — Patient Instructions (Signed)
Filgrastim, G-CSF injection ?What is this medication? ?FILGRASTIM, G-CSF (fil GRA stim) is a granulocyte colony-stimulating factor that stimulates the growth of neutrophils, a type of white blood cell (WBC) important in the body's fight against infection. It is used to reduce the incidence of fever and infection in patients with certain types of cancer who are receiving chemotherapy that affects the bone marrow, to stimulate blood cell production for removal of WBCs from the body prior to a bone marrow transplantation, to reduce the incidence of fever and infection in patients who have severe chronic neutropenia, and to improve survival outcomes following high-dose radiation exposure that is toxic to the bone marrow. ?This medicine may be used for other purposes; ask your health care provider or pharmacist if you have questions. ?COMMON BRAND NAME(S): Neupogen, Nivestym, Releuko, Zarxio ?What should I tell my care team before I take this medication? ?They need to know if you have any of these conditions: ?kidney disease ?latex allergy ?ongoing radiation therapy ?sickle cell disease ?an unusual or allergic reaction to filgrastim, pegfilgrastim, other medicines, foods, dyes, or preservatives ?pregnant or trying to get pregnant ?breast-feeding ?How should I use this medication? ?This medicine is for injection under the skin or infusion into a vein. As an infusion into a vein, it is usually given by a health care professional in a hospital or clinic setting. If you get this medicine at home, you will be taught how to prepare and give this medicine. Refer to the Instructions for Use that come with your medication packaging. Use exactly as directed. Take your medicine at regular intervals. Do not take your medicine more often than directed. ?It is important that you put your used needles and syringes in a special sharps container. Do not put them in a trash can. If you do not have a sharps container, call your pharmacist  or healthcare provider to get one. ?Talk to your pediatrician regarding the use of this medicine in children. While this drug may be prescribed for children as young as 7 months for selected conditions, precautions do apply. ?Overdosage: If you think you have taken too much of this medicine contact a poison control center or emergency room at once. ?NOTE: This medicine is only for you. Do not share this medicine with others. ?What if I miss a dose? ?It is important not to miss your dose. Call your doctor or health care professional if you miss a dose. ?What may interact with this medication? ?This medicine may interact with the following medications: ?medicines that may cause a release of neutrophils, such as lithium ?This list may not describe all possible interactions. Give your health care provider a list of all the medicines, herbs, non-prescription drugs, or dietary supplements you use. Also tell them if you smoke, drink alcohol, or use illegal drugs. Some items may interact with your medicine. ?What should I watch for while using this medication? ?Your condition will be monitored carefully while you are receiving this medicine. ?You may need blood work done while you are taking this medicine. ?Talk to your health care provider about your risk of cancer. You may be more at risk for certain types of cancer if you take this medicine. ?What side effects may I notice from receiving this medication? ?Side effects that you should report to your doctor or health care professional as soon as possible: ?allergic reactions like skin rash, itching or hives, swelling of the face, lips, or tongue ?back pain ?dizziness or feeling faint ?fever ?pain, redness, or   irritation at site where injected ?pinpoint red spots on the skin ?shortness of breath or breathing problems ?signs and symptoms of kidney injury like trouble passing urine, change in the amount of urine, or red or dark-brown urine ?stomach or side pain, or pain at  the shoulder ?swelling ?tiredness ?unusual bleeding or bruising ?Side effects that usually do not require medical attention (report to your doctor or health care professional if they continue or are bothersome): ?bone pain ?cough ?diarrhea ?hair loss ?headache ?muscle pain ?This list may not describe all possible side effects. Call your doctor for medical advice about side effects. You may report side effects to FDA at 1-800-FDA-1088. ?Where should I keep my medication? ?Keep out of the reach of children. ?Store in a refrigerator between 2 and 8 degrees C (36 and 46 degrees F). Do not freeze. Keep in carton to protect from light. Throw away this medicine if vials or syringes are left out of the refrigerator for more than 24 hours. Throw away any unused medicine after the expiration date. ?NOTE: This sheet is a summary. It may not cover all possible information. If you have questions about this medicine, talk to your doctor, pharmacist, or health care provider. ?? 2023 Elsevier/Gold Standard (2020-12-13 00:00:00) ? ?

## 2021-05-27 ENCOUNTER — Inpatient Hospital Stay: Payer: No Typology Code available for payment source

## 2021-05-27 VITALS — BP 96/58 | HR 95 | Temp 98.4°F | Resp 18

## 2021-05-27 DIAGNOSIS — Z5111 Encounter for antineoplastic chemotherapy: Secondary | ICD-10-CM | POA: Diagnosis not present

## 2021-05-27 DIAGNOSIS — C50412 Malignant neoplasm of upper-outer quadrant of left female breast: Secondary | ICD-10-CM

## 2021-05-27 MED ORDER — FILGRASTIM-AAFI 300 MCG/0.5ML IJ SOSY
300.0000 ug | PREFILLED_SYRINGE | Freq: Once | INTRAMUSCULAR | Status: AC
  Administered 2021-05-27: 300 ug via SUBCUTANEOUS
  Filled 2021-05-27: qty 0.5

## 2021-05-28 ENCOUNTER — Other Ambulatory Visit: Payer: Self-pay | Admitting: Hematology and Oncology

## 2021-05-29 MED FILL — Dexamethasone Sodium Phosphate Inj 100 MG/10ML: INTRAMUSCULAR | Qty: 1 | Status: AC

## 2021-05-30 ENCOUNTER — Inpatient Hospital Stay: Payer: No Typology Code available for payment source

## 2021-05-30 ENCOUNTER — Inpatient Hospital Stay (HOSPITAL_BASED_OUTPATIENT_CLINIC_OR_DEPARTMENT_OTHER): Payer: No Typology Code available for payment source | Admitting: Hematology and Oncology

## 2021-05-30 ENCOUNTER — Telehealth: Payer: Self-pay | Admitting: *Deleted

## 2021-05-30 ENCOUNTER — Other Ambulatory Visit: Payer: Self-pay

## 2021-05-30 ENCOUNTER — Encounter: Payer: Self-pay | Admitting: Hematology and Oncology

## 2021-05-30 ENCOUNTER — Other Ambulatory Visit: Payer: Self-pay | Admitting: *Deleted

## 2021-05-30 VITALS — BP 112/74 | HR 99 | Temp 97.4°F | Resp 18 | Ht 62.0 in | Wt 115.4 lb

## 2021-05-30 DIAGNOSIS — Z171 Estrogen receptor negative status [ER-]: Secondary | ICD-10-CM

## 2021-05-30 DIAGNOSIS — Z95828 Presence of other vascular implants and grafts: Secondary | ICD-10-CM

## 2021-05-30 DIAGNOSIS — F458 Other somatoform disorders: Secondary | ICD-10-CM | POA: Insufficient documentation

## 2021-05-30 DIAGNOSIS — Z5111 Encounter for antineoplastic chemotherapy: Secondary | ICD-10-CM | POA: Diagnosis not present

## 2021-05-30 DIAGNOSIS — C50412 Malignant neoplasm of upper-outer quadrant of left female breast: Secondary | ICD-10-CM | POA: Diagnosis not present

## 2021-05-30 LAB — CBC WITH DIFFERENTIAL (CANCER CENTER ONLY)
Abs Immature Granulocytes: 0.2 10*3/uL — ABNORMAL HIGH (ref 0.00–0.07)
Basophils Absolute: 0.1 10*3/uL (ref 0.0–0.1)
Basophils Relative: 1 %
Eosinophils Absolute: 0.1 10*3/uL (ref 0.0–0.5)
Eosinophils Relative: 1 %
HCT: 29.1 % — ABNORMAL LOW (ref 36.0–46.0)
Hemoglobin: 9.5 g/dL — ABNORMAL LOW (ref 12.0–15.0)
Immature Granulocytes: 4 %
Lymphocytes Relative: 29 %
Lymphs Abs: 1.4 10*3/uL (ref 0.7–4.0)
MCH: 28.8 pg (ref 26.0–34.0)
MCHC: 32.6 g/dL (ref 30.0–36.0)
MCV: 88.2 fL (ref 80.0–100.0)
Monocytes Absolute: 0.7 10*3/uL (ref 0.1–1.0)
Monocytes Relative: 14 %
Neutro Abs: 2.5 10*3/uL (ref 1.7–7.7)
Neutrophils Relative %: 51 %
Platelet Count: 194 10*3/uL (ref 150–400)
RBC: 3.3 MIL/uL — ABNORMAL LOW (ref 3.87–5.11)
RDW: 16.8 % — ABNORMAL HIGH (ref 11.5–15.5)
Smear Review: NORMAL
WBC Count: 4.9 10*3/uL (ref 4.0–10.5)
nRBC: 0 % (ref 0.0–0.2)

## 2021-05-30 LAB — CMP (CANCER CENTER ONLY)
ALT: 23 U/L (ref 0–44)
AST: 26 U/L (ref 15–41)
Albumin: 3.9 g/dL (ref 3.5–5.0)
Alkaline Phosphatase: 88 U/L (ref 38–126)
Anion gap: 7 (ref 5–15)
BUN: 12 mg/dL (ref 6–20)
CO2: 29 mmol/L (ref 22–32)
Calcium: 9.5 mg/dL (ref 8.9–10.3)
Chloride: 105 mmol/L (ref 98–111)
Creatinine: 0.86 mg/dL (ref 0.44–1.00)
GFR, Estimated: >60 mL/min (ref >60)
Glucose, Bld: 103 mg/dL — ABNORMAL HIGH (ref 70–99)
Potassium: 3.7 mmol/L (ref 3.5–5.1)
Sodium: 141 mmol/L (ref 135–145)
Total Bilirubin: 0.5 mg/dL (ref 0.3–1.2)
Total Protein: 7.6 g/dL (ref 6.5–8.1)

## 2021-05-30 LAB — TSH: TSH: 2.82 u[IU]/mL (ref 0.350–4.500)

## 2021-05-30 MED ORDER — SODIUM CHLORIDE 0.9 % IV SOLN: Freq: Once | INTRAVENOUS | Status: AC

## 2021-05-30 MED ORDER — SODIUM CHLORIDE 0.9 % IV SOLN
60.0000 mg/m2 | Freq: Once | INTRAVENOUS | Status: AC
  Administered 2021-05-30: 96 mg via INTRAVENOUS
  Filled 2021-05-30: qty 16

## 2021-05-30 MED ORDER — ONDANSETRON HCL 4 MG/2ML IJ SOLN
8.0000 mg | Freq: Once | INTRAMUSCULAR | Status: AC
  Administered 2021-05-30: 8 mg via INTRAVENOUS
  Filled 2021-05-30: qty 4

## 2021-05-30 MED ORDER — SODIUM CHLORIDE 0.9 % IV SOLN
128.2500 mg | Freq: Once | INTRAVENOUS | Status: AC
  Administered 2021-05-30: 130 mg via INTRAVENOUS
  Filled 2021-05-30: qty 13

## 2021-05-30 MED ORDER — GABAPENTIN 300 MG PO CAPS: 300.0000 mg | ORAL_CAPSULE | Freq: Every day | ORAL | 0 refills | Status: AC

## 2021-05-30 MED ORDER — FAMOTIDINE IN NACL 20-0.9 MG/50ML-% IV SOLN
20.0000 mg | Freq: Once | INTRAVENOUS | Status: AC
  Administered 2021-05-30: 20 mg via INTRAVENOUS
  Filled 2021-05-30: qty 50

## 2021-05-30 MED ORDER — DEXAMETHASONE SODIUM PHOSPHATE 10 MG/ML IJ SOLN
5.0000 mg | Freq: Once | INTRAMUSCULAR | Status: AC
  Administered 2021-05-30: 5 mg via INTRAVENOUS
  Filled 2021-05-30: qty 1

## 2021-05-30 MED ORDER — SODIUM CHLORIDE 0.9% FLUSH
10.0000 mL | INTRAVENOUS | Status: DC | PRN
  Administered 2021-05-30: 10 mL

## 2021-05-30 MED ORDER — CETIRIZINE HCL 10 MG/ML IV SOLN
10.0000 mg | Freq: Once | INTRAVENOUS | Status: AC
  Administered 2021-05-30: 10 mg via INTRAVENOUS
  Filled 2021-05-30: qty 1

## 2021-05-30 MED ORDER — SODIUM CHLORIDE 0.9% FLUSH
10.0000 mL | Freq: Once | INTRAVENOUS | Status: AC
  Administered 2021-05-30: 10 mL

## 2021-05-30 MED ORDER — HEPARIN SOD (PORK) LOCK FLUSH 100 UNIT/ML IV SOLN
500.0000 [IU] | Freq: Once | INTRAVENOUS | Status: AC | PRN
  Administered 2021-05-30: 500 [IU]

## 2021-05-30 MED ORDER — SODIUM CHLORIDE 0.9 % IV SOLN
200.0000 mg | Freq: Once | INTRAVENOUS | Status: AC
  Administered 2021-05-30: 200 mg via INTRAVENOUS
  Filled 2021-05-30: qty 200

## 2021-05-30 NOTE — Assessment & Plan Note (Signed)
She complains of severe itch/burn which is triggered by no clear factors although she wonders if this could be from premedications. ?She says itching is worse than the cancer or chemo itself ?Right after I finished the exam, she couldn't with stand the itch and sat there crying for almost 15 minutes. ?She says that at home nothing has changed such as detergents or soap that occurred causes itching ?I wonder if there is a neuropathic component to this itching and hence have recommended trying gabapentin nightly for management of itching.  Since she also suspects the premedications could be a possible culprit, we have agreed to remove the Aloxi as well as reduce the dexamethasone.  She is very reluctant to take the gabapentin however I will send her a prescription if she decides to try it. ?

## 2021-05-30 NOTE — Patient Instructions (Signed)
[facility]  Discharge Instructions: ?Thank you for choosing [facility] to provide your oncology and hematology care.  ? ?If you have a lab appointment with the [facility], please go directly to the [facility] and check in at the registration area. ?  ?Wear comfortable clothing and clothing appropriate for easy access to any Portacath or PICC line.  ? ?We strive to give you quality time with your provider. You may need to reschedule your appointment if you arrive late (15 or more minutes).  Arriving late affects you and other patients whose appointments are after yours.  Also, if you miss three or more appointments without notifying the office, you may be dismissed from the clinic at the provider?s discretion.    ?  ?For prescription refill requests, have your pharmacy contact our office and allow 72 hours for refills to be completed.   ? ?Today you received the following chemotherapy and/or immunotherapy agents: keytruda, taxol, carboplatin    ?  ?To help prevent nausea and vomiting after your treatment, we encourage you to take your nausea medication as directed. ? ?BELOW ARE SYMPTOMS THAT SHOULD BE REPORTED IMMEDIATELY: ?*FEVER GREATER THAN 100.4 F (38 ?C) OR HIGHER ?*CHILLS OR SWEATING ?*NAUSEA AND VOMITING THAT IS NOT CONTROLLED WITH YOUR NAUSEA MEDICATION ?*UNUSUAL SHORTNESS OF BREATH ?*UNUSUAL BRUISING OR BLEEDING ?*URINARY PROBLEMS (pain or burning when urinating, or frequent urination) ?*BOWEL PROBLEMS (unusual diarrhea, constipation, pain near the anus) ?TENDERNESS IN MOUTH AND THROAT WITH OR WITHOUT PRESENCE OF ULCERS (sore throat, sores in mouth, or a toothache) ?UNUSUAL RASH, SWELLING OR PAIN  ?UNUSUAL VAGINAL DISCHARGE OR ITCHING  ? ?Items with * indicate a potential emergency and should be followed up as soon as possible or go to the Emergency Department if any problems should occur. ? ?Please show the CHEMOTHERAPY ALERT CARD or IMMUNOTHERAPY ALERT  CARD at check-in to the Emergency Department and triage nurse. ? ?Should you have questions after your visit or need to cancel or reschedule your appointment, please contact [facility]  Dept: [phone]  and follow the prompts.  Office hours are 8:00 a.m. to 4:30 p.m. Monday - Friday. Please note that voicemails left after 4:00 p.m. may not be returned until the following business day.  We are closed weekends and major holidays. You have access to a nurse at all times for urgent questions. Please call the main number to the clinic Dept: [phone] and follow the prompts. ? ? ?For any non-urgent questions, you may also contact your provider using MyChart. We now offer e-Visits for anyone 64 and older to request care online for non-urgent symptoms. For details visit [email or web address]. ?  ?Also download the MyChart app! Go to the app store, search "MyChart", open the app, select [facility], and log in with your MyChart username and password. ? ?Due to Covid, a mask is required upon entering the hospital/clinic. If you do not have a mask, one will be given to you upon arrival. For doctor visits, patients may have 1 support person aged 45 or older with them. For treatment visits, patients cannot have anyone with them due to current Covid guidelines and our immunocompromised population.  ? ?

## 2021-05-30 NOTE — Assessment & Plan Note (Addendum)
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 ? 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 ? ? ?Treatment Plan:  ?1. Neoadjuvant chemotherapy with pembrolizumab, carboplatin, Taxol followed by pembrolizumab, Adriamycin, Cytoxan. ?2. Followed by breast conserving surgery with sentinel lymph node study ?3. Followed by adjuvant radiation therapy ?No role for antiestrogen therapy since this is a triple negative breast cancer ?4. Adjuvant immunotherapy for 9 cycles. ? ?She is tolerating carbotaxol/keytruda well. No major adverse effects reported except for itching. ?Labs today satisfactory to proceed with chemoimmunotherapy.   ?Physical examination, tumor slightly smaller compared to last visit consistent with response. The tumor continues to be softer, consistent with necrosis. Today the tumor measured about 2*2 cms consistent with response.  I have decreased the paclitaxel dose because patient experiences what she describes as a itch burn which is most prominent in her hands.  Since this could be a manifestation of neuropathy, have agreed to decrease the paclitaxel dose to 50 mg per metered square.  We will continue to monitor ?

## 2021-05-30 NOTE — Progress Notes (Signed)
[facility] Follow up: ?  ? ?[address], [address] ?[address], [address] ?[place] [address] ? ? ?DIAGNOSIS:  Cancer Staging  ?Malignant neoplasm of upper-outer quadrant of left breast in female, estrogen receptor negative ([facility]) ?Staging form: Breast, AJCC 8th Edition ?- Clinical stage from 03/07/2021: Stage IIB (cT2, cN0, cM0, G3, ER-, PR-, HER2-) - Signed by Nicholas Lose, MD on 03/07/2021 ?Stage prefix: Initial diagnosis ?Histologic grading system: 3 grade system ? ? ?SUMMARY OF ONCOLOGIC HISTORY: ?Oncology History  ?Malignant neoplasm of upper-outer quadrant of left breast in female, estrogen receptor negative ([facility])  ?02/12/2021 Initial Diagnosis  ? palpable solid mass in the left upper outer quadrant. Diagnostic mammogram on 02/12/2021 showed a 3 cm mass in the left upper outer quadrant. Biopsy on 02/20/2021 showed grade 3 invasive ductal carcinoma, ER/PR/Her2-([facility]) ?  ?03/07/2021 Cancer Staging  ? Staging form: Breast, AJCC 8th Edition ?- Clinical stage from 03/07/2021: Stage IIB (cT2, cN0, cM0, G3, ER-, PR-, HER2-) - Signed by Nicholas Lose, MD on 03/07/2021 ?Stage prefix: Initial diagnosis ?Histologic grading system: 3 grade system ? ?  ?03/28/2021 -  Chemotherapy  ? Patient is on Treatment Plan : BREAST Pembrolizumab (200) D1 + Carboplatin (1.5) D1,8,15 + Paclitaxel (80) D1,8,15 q21d X 4 cycles / Pembrolizumab (200) D1 + AC D1 q21d x 4 cycles  ? ?  ?  ? ? ?CURRENT THERAPY: Taxol, Carbo, Pembrolizumab ? ?INTERVAL HISTORY: ? ?Amanda Smith 56 y.o. female returns for evaluation prior to receiving her next cycle of neoadjuvant chemotherapy.   ?Baseline ECHO March 21, 2021 and showed a normal left ventricular ejection fraction of 60 to 65%. ?She is doing well so far. She says skin is very sensitive, she feels like skin is itchy/burning everywhere but its more sensitive especially in hands and feet.  She says the itching episodes start randomly and she was crying,  emotional during the examination because she started having an itching episode. ?She doesn't describe it as a neuropathy.   ?No change in breathing. ?No change in bowel habits or urinary habits.   ?She thinks it could be the dexamethasone or some of the premedications for chemotherapy that can be causing her symptoms.  She says she has not changed anything with her detergents or other products.  She has been applying coconut oil on the areas which itch. ?She does not examine the breast mass very frequently so she is not quite sure if there is significant response ? ?Rest of the pertinent 10 point ROS reviewed and negative. ? ?Patient Active Problem List  ? Diagnosis Date Noted  ? 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 Port-A-Cath in place 05/09/2021  ? Malignant neoplasm of upper-outer quadrant of left breast in female, estrogen receptor negative ([facility]) 03/06/2021  ? ? ?is allergic to benadryl [diphenhydramine]. ? ?MEDICAL HISTORY: ?Past Medical History:  ?Diagnosis Date  ? Breast cancer ([facility])   ? ? ?SURGICAL HISTORY: ?Past Surgical History:  ?Procedure Laterality Date  ? CESAREAN SECTION    ? PORTACATH PLACEMENT Right 03/18/2021  ? Procedure: INSERTION PORT-A-CATH;  Surgeon: Rolm Bookbinder, MD;  Location: [facility];  Service: General;  Laterality: Right;  ? WRIST FRACTURE SURGERY    ? ? ?SOCIAL HISTORY: ?Social History  ? ?Socioeconomic History  ? Marital status: Married  ?  Spouse name: Not on file  ? Number of children: Not on file  ? Years of education: Not on file  ? Highest education level: Not on file  ?  Occupational History  ? Not on file  ?Tobacco Use  ? Smoking status: Never  ? Smokeless tobacco: Never  ?Vaping Use  ? Vaping Use: Never used  ?Substance and Sexual Activity  ? Alcohol use: Never  ? Drug use: Never  ? Sexual activity: Yes  ?  Birth control/protection: Post-menopausal  ?Other Topics Concern  ? Not on file  ?Social History Narrative  ? Not on file  ? ?Social Determinants of Health   ? ?Financial Resource Strain: Not on file  ?Food Insecurity: Not on file  ?Transportation Needs: Not on file  ?Physical Activity: Not on file  ?Stress: Not on file  ?Social Connections: Not on file  ?Intimate Partner Violence: Not on file  ? ? ?FAMILY HISTORY: ?Family History  ?Problem Relation Age of Onset  ? Breast cancer Sister   ? ? ?Review of Systems  ?Constitutional:  Negative for appetite change, chills, fatigue, fever and unexpected weight change.  ?HENT:   Negative for hearing loss, lump/mass and trouble swallowing.   ?Eyes:  Negative for eye problems and icterus.  ?Respiratory:  Negative for chest tightness, cough and shortness of breath.   ?Cardiovascular:  Negative for chest pain, leg swelling and palpitations.  ?Gastrointestinal:  Negative for abdominal distention, abdominal pain, constipation, diarrhea, nausea and vomiting.  ?Endocrine: Negative for hot flashes.  ?Genitourinary:  Negative for difficulty urinating.   ?Musculoskeletal:  Negative for arthralgias.  ?Skin:  Positive for itching. Negative for rash.  ?Neurological:  Negative for dizziness, extremity weakness, headaches and numbness.  ?Hematological:  Negative for adenopathy. Does not bruise/bleed easily.  ?Psychiatric/Behavioral:  Negative for depression. The patient is not nervous/anxious.    ? ? ?PHYSICAL EXAMINATION ? ?ECOG PERFORMANCE STATUS: 1 - Symptomatic but completely ambulatory ? ?Vitals:  ? 05/30/21 0854  ?BP: 112/74  ?Pulse: 99  ?Resp: 18  ?Temp: (!) 97.4 ?F (36.3 ?C)  ?SpO2: 100%  ? ? ? ?Physical Exam ?Constitutional:   ?   General: She is not in acute distress. ?   Appearance: Normal appearance. She is not toxic-appearing.  ?HENT:  ?   Head: Normocephalic and atraumatic.  ?Eyes:  ?   General: No scleral icterus. ?Cardiovascular:  ?   Rate and Rhythm: Normal rate and regular rhythm.  ?   Pulses: Normal pulses.  ?   Heart sounds: Normal heart sounds.  ?Pulmonary:  ?   Effort: Pulmonary effort is normal.  ?   Breath sounds: Normal  breath sounds.  ?Chest:  ?   Comments: Left breast with UOQ mass horizontal dimension is 2*2 cms. NO palpable axillary lymphadenopathy ?Abdominal:  ?   General: Abdomen is flat. Bowel sounds are normal. There is no distension.  ?   Palpations: Abdomen is soft.  ?   Tenderness: There is no abdominal tenderness.  ?Musculoskeletal:     ?   General: No swelling.  ?   Cervical back: Neck supple.  ?Lymphadenopathy:  ?   Cervical: No cervical adenopathy.  ?Skin: ?   General: Skin is warm and dry.  ?   Findings: No rash.  ?Neurological:  ?   General: No focal deficit present.  ?   Mental Status: She is alert.  ?Psychiatric:     ?   Mood and Affect: Mood normal.     ?   Behavior: Behavior normal.  ? ? ?LABORATORY DATA: ? ?CBC ?   ?Component Value Date/Time  ? WBC 4.9 05/30/2021 0828  ? WBC 5.7 11/23/2006 0325  ?  RBC 3.30 (L) 05/30/2021 4417  ? HGB 9.5 (L) 05/30/2021 0828  ? HCT 29.1 (L) 05/30/2021 1278  ? PLT 194 05/30/2021 0828  ? MCV 88.2 05/30/2021 0828  ? MCH 28.8 05/30/2021 0828  ? MCHC 32.6 05/30/2021 0828  ? RDW 16.8 (H) 05/30/2021 7183  ? LYMPHSABS 1.4 05/30/2021 0828  ? MONOABS 0.7 05/30/2021 0828  ? EOSABS 0.1 05/30/2021 0828  ? BASOSABS 0.1 05/30/2021 0828  ? ? ?CMP  ?   ?Component Value Date/Time  ? NA 141 05/30/2021 0828  ? K 3.7 05/30/2021 0828  ? CL 105 05/30/2021 0828  ? CO2 29 05/30/2021 0828  ? GLUCOSE 103 (H) 05/30/2021 6725  ? BUN 12 05/30/2021 0828  ? CREATININE 0.86 05/30/2021 0828  ? CALCIUM 9.5 05/30/2021 0828  ? PROT 7.6 05/30/2021 0828  ? ALBUMIN 3.9 05/30/2021 0828  ? AST 26 05/30/2021 0828  ? ALT 23 05/30/2021 0828  ? ALKPHOS 88 05/30/2021 0828  ? BILITOT 0.5 05/30/2021 0828  ? GFRNONAA >60 05/30/2021 0828  ? ? ? ? ?ASSESSMENT and THERAPY PLAN:  ? 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 ? 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 ? ? ?Treatment Plan:  ?1. Neoadjuvant chemotherapy with pembrolizumab, carboplatin, Taxol fol

## 2021-05-31 LAB — T4: T4, Total: 7.7 ug/dL (ref 4.5–12.0)

## 2021-06-03 ENCOUNTER — Telehealth: Payer: Self-pay | Admitting: *Deleted

## 2021-06-03 ENCOUNTER — Other Ambulatory Visit: Payer: Self-pay | Admitting: *Deleted

## 2021-06-03 NOTE — Telephone Encounter (Signed)
This RN spoke with pt today

## 2021-06-05 ENCOUNTER — Encounter: Payer: Self-pay | Admitting: *Deleted

## 2021-06-06 ENCOUNTER — Telehealth: Payer: Self-pay | Admitting: Hematology and Oncology

## 2021-06-06 ENCOUNTER — Inpatient Hospital Stay: Payer: No Typology Code available for payment source

## 2021-06-06 ENCOUNTER — Other Ambulatory Visit: Payer: Self-pay | Admitting: Hematology and Oncology

## 2021-06-06 ENCOUNTER — Other Ambulatory Visit: Payer: Self-pay

## 2021-06-06 VITALS — BP 100/49 | HR 99 | Temp 98.5°F | Resp 17 | Wt 112.5 lb

## 2021-06-06 DIAGNOSIS — Z95828 Presence of other vascular implants and grafts: Secondary | ICD-10-CM

## 2021-06-06 DIAGNOSIS — Z171 Estrogen receptor negative status [ER-]: Secondary | ICD-10-CM

## 2021-06-06 DIAGNOSIS — Z5111 Encounter for antineoplastic chemotherapy: Secondary | ICD-10-CM | POA: Diagnosis not present

## 2021-06-06 LAB — CMP (CANCER CENTER ONLY)
ALT: 23 U/L (ref 0–44)
AST: 25 U/L (ref 15–41)
Albumin: 3.8 g/dL (ref 3.5–5.0)
Alkaline Phosphatase: 66 U/L (ref 38–126)
Anion gap: 6 (ref 5–15)
BUN: 16 mg/dL (ref 6–20)
CO2: 28 mmol/L (ref 22–32)
Calcium: 9.1 mg/dL (ref 8.9–10.3)
Chloride: 107 mmol/L (ref 98–111)
Creatinine: 0.8 mg/dL (ref 0.44–1.00)
GFR, Estimated: >60 mL/min (ref >60)
Glucose, Bld: 143 mg/dL — ABNORMAL HIGH (ref 70–99)
Potassium: 3.7 mmol/L (ref 3.5–5.1)
Sodium: 141 mmol/L (ref 135–145)
Total Bilirubin: 0.8 mg/dL (ref 0.3–1.2)
Total Protein: 7.3 g/dL (ref 6.5–8.1)

## 2021-06-06 LAB — CBC WITH DIFFERENTIAL (CANCER CENTER ONLY)
Abs Immature Granulocytes: 0.01 10*3/uL (ref 0.00–0.07)
Basophils Absolute: 0 10*3/uL (ref 0.0–0.1)
Basophils Relative: 1 %
Eosinophils Absolute: 0 10*3/uL (ref 0.0–0.5)
Eosinophils Relative: 2 %
HCT: 27.3 % — ABNORMAL LOW (ref 36.0–46.0)
Hemoglobin: 9 g/dL — ABNORMAL LOW (ref 12.0–15.0)
Immature Granulocytes: 0 %
Lymphocytes Relative: 40 %
Lymphs Abs: 0.9 10*3/uL (ref 0.7–4.0)
MCH: 29.3 pg (ref 26.0–34.0)
MCHC: 33 g/dL (ref 30.0–36.0)
MCV: 88.9 fL (ref 80.0–100.0)
Monocytes Absolute: 0.2 10*3/uL (ref 0.1–1.0)
Monocytes Relative: 8 %
Neutro Abs: 1.1 10*3/uL — ABNORMAL LOW (ref 1.7–7.7)
Neutrophils Relative %: 49 %
Platelet Count: 185 10*3/uL (ref 150–400)
RBC: 3.07 MIL/uL — ABNORMAL LOW (ref 3.87–5.11)
RDW: 17.1 % — ABNORMAL HIGH (ref 11.5–15.5)
WBC Count: 2.3 10*3/uL — ABNORMAL LOW (ref 4.0–10.5)
nRBC: 0 % (ref 0.0–0.2)

## 2021-06-06 LAB — TSH: TSH: 2.159 u[IU]/mL (ref 0.350–4.500)

## 2021-06-06 MED ORDER — SODIUM CHLORIDE 0.9% FLUSH
10.0000 mL | Freq: Once | INTRAVENOUS | Status: AC
  Administered 2021-06-06: 10 mL

## 2021-06-06 MED ORDER — HEPARIN SOD (PORK) LOCK FLUSH 100 UNIT/ML IV SOLN
500.0000 [IU] | Freq: Once | INTRAVENOUS | Status: AC
  Administered 2021-06-06: 500 [IU]

## 2021-06-06 NOTE — Progress Notes (Signed)
Pt reports itching/stinging/burning more in hands since speaking with Val, RN on Tuesday. She reports only in her feet 1-2xs. She states she would like to discontinue whichever medication is causing all of the symptoms she is feeling. MD notified. Pt ANC 1.1 and wt has decreased slightly, pt reports not feeling spiritually or physically ready for chemo today. Chemo omitted for today. Pt instructed to keep a diary of how she is feeling this week and will discuss with MD next week. Val, RN sending scheduling msg to schedule pt for MD appt next week before infusion.  ?

## 2021-06-06 NOTE — Progress Notes (Signed)
According to patient's discussion with Val, she felt better after dose reduction and reducing dex dose. ?She would like to continue treatment on the current dose. ? ?[name]  ? ?

## 2021-06-06 NOTE — Telephone Encounter (Signed)
Could not leave vmail, calendar mailed ?

## 2021-06-07 LAB — T4: T4, Total: 6.5 ug/dL (ref 4.5–12.0)

## 2021-06-13 ENCOUNTER — Inpatient Hospital Stay (HOSPITAL_BASED_OUTPATIENT_CLINIC_OR_DEPARTMENT_OTHER): Payer: No Typology Code available for payment source | Admitting: Hematology and Oncology

## 2021-06-13 ENCOUNTER — Encounter: Payer: Self-pay | Admitting: Hematology and Oncology

## 2021-06-13 ENCOUNTER — Inpatient Hospital Stay: Payer: No Typology Code available for payment source

## 2021-06-13 ENCOUNTER — Other Ambulatory Visit: Payer: Self-pay

## 2021-06-13 VITALS — BP 105/64 | HR 91 | Temp 98.2°F | Resp 18 | Ht 62.0 in | Wt 114.4 lb

## 2021-06-13 DIAGNOSIS — C50412 Malignant neoplasm of upper-outer quadrant of left female breast: Secondary | ICD-10-CM | POA: Diagnosis not present

## 2021-06-13 DIAGNOSIS — Z5111 Encounter for antineoplastic chemotherapy: Secondary | ICD-10-CM | POA: Diagnosis not present

## 2021-06-13 DIAGNOSIS — Z95828 Presence of other vascular implants and grafts: Secondary | ICD-10-CM

## 2021-06-13 DIAGNOSIS — Z171 Estrogen receptor negative status [ER-]: Secondary | ICD-10-CM

## 2021-06-13 LAB — CBC WITH DIFFERENTIAL (CANCER CENTER ONLY)
Abs Immature Granulocytes: 0.01 10*3/uL (ref 0.00–0.07)
Basophils Absolute: 0 10*3/uL (ref 0.0–0.1)
Basophils Relative: 1 %
Eosinophils Absolute: 0.1 10*3/uL (ref 0.0–0.5)
Eosinophils Relative: 2 %
HCT: 29.6 % — ABNORMAL LOW (ref 36.0–46.0)
Hemoglobin: 9.7 g/dL — ABNORMAL LOW (ref 12.0–15.0)
Immature Granulocytes: 0 %
Lymphocytes Relative: 34 %
Lymphs Abs: 1.1 10*3/uL (ref 0.7–4.0)
MCH: 29.2 pg (ref 26.0–34.0)
MCHC: 32.8 g/dL (ref 30.0–36.0)
MCV: 89.2 fL (ref 80.0–100.0)
Monocytes Absolute: 0.3 10*3/uL (ref 0.1–1.0)
Monocytes Relative: 11 %
Neutro Abs: 1.6 10*3/uL — ABNORMAL LOW (ref 1.7–7.7)
Neutrophils Relative %: 52 %
Platelet Count: 227 10*3/uL (ref 150–400)
RBC: 3.32 MIL/uL — ABNORMAL LOW (ref 3.87–5.11)
RDW: 17.3 % — ABNORMAL HIGH (ref 11.5–15.5)
WBC Count: 3.2 10*3/uL — ABNORMAL LOW (ref 4.0–10.5)
nRBC: 0 % (ref 0.0–0.2)

## 2021-06-13 LAB — CMP (CANCER CENTER ONLY)
ALT: 23 U/L (ref 0–44)
AST: 28 U/L (ref 15–41)
Albumin: 3.9 g/dL (ref 3.5–5.0)
Alkaline Phosphatase: 83 U/L (ref 38–126)
Anion gap: 7 (ref 5–15)
BUN: 19 mg/dL (ref 6–20)
CO2: 28 mmol/L (ref 22–32)
Calcium: 9.2 mg/dL (ref 8.9–10.3)
Chloride: 107 mmol/L (ref 98–111)
Creatinine: 0.87 mg/dL (ref 0.44–1.00)
GFR, Estimated: >60 mL/min (ref >60)
Glucose, Bld: 101 mg/dL — ABNORMAL HIGH (ref 70–99)
Potassium: 3.6 mmol/L (ref 3.5–5.1)
Sodium: 142 mmol/L (ref 135–145)
Total Bilirubin: 0.8 mg/dL (ref 0.3–1.2)
Total Protein: 7.7 g/dL (ref 6.5–8.1)

## 2021-06-13 LAB — TSH: TSH: 2.44 u[IU]/mL (ref 0.350–4.500)

## 2021-06-13 MED ORDER — HEPARIN SOD (PORK) LOCK FLUSH 100 UNIT/ML IV SOLN
500.0000 [IU] | Freq: Once | INTRAVENOUS | Status: AC | PRN
  Administered 2021-06-13: 500 [IU]

## 2021-06-13 MED ORDER — SODIUM CHLORIDE 0.9 % IV SOLN
127.2000 mg | Freq: Once | INTRAVENOUS | Status: AC
  Administered 2021-06-13: 130 mg via INTRAVENOUS
  Filled 2021-06-13: qty 13

## 2021-06-13 MED ORDER — SODIUM CHLORIDE 0.9 % IV SOLN: Freq: Once | INTRAVENOUS | Status: AC

## 2021-06-13 MED ORDER — DEXAMETHASONE SODIUM PHOSPHATE 10 MG/ML IJ SOLN
5.0000 mg | Freq: Once | INTRAMUSCULAR | Status: AC
  Administered 2021-06-13: 5 mg via INTRAVENOUS
  Filled 2021-06-13: qty 1

## 2021-06-13 MED ORDER — SODIUM CHLORIDE 0.9% FLUSH
10.0000 mL | INTRAVENOUS | Status: DC | PRN
  Administered 2021-06-13: 10 mL

## 2021-06-13 MED ORDER — SODIUM CHLORIDE 0.9% FLUSH
10.0000 mL | Freq: Once | INTRAVENOUS | Status: AC
  Administered 2021-06-13: 10 mL

## 2021-06-13 MED ORDER — ONDANSETRON HCL 4 MG/2ML IJ SOLN
8.0000 mg | Freq: Once | INTRAMUSCULAR | Status: AC
  Administered 2021-06-13: 8 mg via INTRAVENOUS
  Filled 2021-06-13: qty 4

## 2021-06-13 NOTE — Assessment & Plan Note (Signed)
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  01/2021: Left upper outer quadrant biopsy shows grade 3, 3 cm invasive ductal carcinoma triple negative.  Treatment Plan:  1. Neoadjuvant chemotherapy with pembrolizumab, carboplatin, Taxol followed by pembrolizumab, Adriamycin, Cytoxan. 2. Followed by breast conserving surgery with sentinel lymph node study 3. Followed by adjuvant radiation therapy No role for antiestrogen therapy since this is a triple negative breast cancer 4. Adjuvant immunotherapy for 9 cycles.  She continues to have itching, random episodes, no clear aggravating or relieving factors. Today we have discussed about omitting taxol and monitor. If she still has ongoing itching despite omitting taxol, then we can consider holding Keytruda. She is agreeable to this plan> I have also recommended gabapentin for neurogenic pruritus

## 2021-06-13 NOTE — Progress Notes (Signed)
Cancer Center Cancer Follow up:    Pa, [facility] [address], [address] [place] [address]   DIAGNOSIS:  Cancer Staging  Malignant neoplasm of upper-outer quadrant of left breast in female, estrogen receptor negative (HCC) Staging form: Breast, AJCC 8th Edition - Clinical stage from 03/07/2021: Stage IIB (cT2, cN0, cM0, G3, ER-, PR-, HER2-) - Signed by Gudena, Vinay, MD on 03/07/2021 Stage prefix: Initial diagnosis Histologic grading system: 3 grade system   SUMMARY OF ONCOLOGIC HISTORY: Oncology History  Malignant neoplasm of upper-outer quadrant of left breast in female, estrogen receptor negative (HCC)  02/12/2021 Initial Diagnosis   palpable solid mass in the left upper outer quadrant. Diagnostic mammogram on 02/12/2021 showed a 3 cm mass in the left upper outer quadrant. Biopsy on 02/20/2021 showed grade 3 invasive ductal carcinoma, ER/PR/Her2-([facility])   03/07/2021 Cancer Staging   Staging form: Breast, AJCC 8th Edition - Clinical stage from 03/07/2021: Stage IIB (cT2, cN0, cM0, G3, ER-, PR-, HER2-) - Signed by Gudena, Vinay, MD on 03/07/2021 Stage prefix: Initial diagnosis Histologic grading system: 3 grade system    03/28/2021 -  Chemotherapy   Patient is on Treatment Plan : BREAST Pembrolizumab (200) D1 + Carboplatin (1.5) D1,8,15 + Paclitaxel (80) D1,8,15 q21d X 4 cycles / Pembrolizumab (200) D1 + AC D1 q21d x 4 cycles       CURRENT THERAPY: Taxol, Carbo, Pembrolizumab  INTERVAL HISTORY:  Amanda Smith 55 y.o. female returns for evaluation prior to receiving her next cycle of neoadjuvant chemotherapy.   Baseline ECHO March 21, 2021 and showed a normal left ventricular ejection fraction of 60 to 65%. Patient was seen today in infusion.  She tells me that after her last infusion she continued to have itching and cannot quite find a triggering factor.  She says the itching is worse on the chemotherapy itself.  She was hoping we can  modify some chemotherapy today so she can be relieved of itching.  She did not take any gabapentin. She otherwise denies any changes  Rest of the pertinent 10 point ROS reviewed and negative.  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  MEDICAL HISTORY: Past Medical History:  Diagnosis Date   Breast cancer (HCC)     SURGICAL HISTORY: Past Surgical History:  Procedure Laterality Date   CESAREAN SECTION     PORTACATH PLACEMENT Right 03/18/2021   Procedure: INSERTION PORT-A-CATH;  Surgeon: Wakefield, Matthew, MD;  Location: [facility];  Service: General;  Laterality: Right;   WRIST FRACTURE SURGERY      SOCIAL HISTORY: Social History   Socioeconomic History   Marital status: Married    Spouse name: Not on file   Number of children: Not on file   Years of education: Not on file   Highest education level: Not on file  Occupational History   Not on file  Tobacco Use   Smoking status: Never   Smokeless tobacco: Never  Vaping Use   Vaping Use: Never used  Substance and Sexual Activity   Alcohol use: Never   Drug use: Never   Sexual activity: Yes    Birth control/protection: Post-menopausal  Other Topics Concern   Not on file  Social History Narrative   Not on file     Social Determinants of Health   Financial Resource Strain: Not on file  Food Insecurity: Not on file  Transportation Needs: Not on file  Physical Activity: Not on file  Stress: Not on file  Social Connections: Not on file  Intimate Partner Violence: Not on file    FAMILY HISTORY: Family History  Problem Relation Age of Onset   Breast cancer Sister     Review of Systems  Constitutional:  Negative for appetite change, chills, fatigue, fever and  unexpected weight change.  HENT:   Negative for hearing loss, lump/mass and trouble swallowing.   Eyes:  Negative for eye problems and icterus.  Respiratory:  Negative for chest tightness, cough and shortness of breath.   Cardiovascular:  Negative for chest pain, leg swelling and palpitations.  Gastrointestinal:  Negative for abdominal distention, abdominal pain, constipation, diarrhea, nausea and vomiting.  Endocrine: Negative for hot flashes.  Genitourinary:  Negative for difficulty urinating.   Musculoskeletal:  Negative for arthralgias.  Skin:  Positive for itching. Negative for rash.  Neurological:  Negative for dizziness, extremity weakness, headaches and numbness.  Hematological:  Negative for adenopathy. Does not bruise/bleed easily.  Psychiatric/Behavioral:  Negative for depression. The patient is not nervous/anxious.      PHYSICAL EXAMINATION  ECOG PERFORMANCE STATUS: 1 - Symptomatic but completely ambulatory  There were no vitals filed for this visit.    Physical Exam Constitutional:      Appearance: Normal appearance.  Eyes:     General: No scleral icterus. Chest:     Comments: Breast exam not done today Skin:    General: Skin is warm and dry.  Neurological:     Mental Status: She is alert.    LABORATORY DATA:  CBC    Component Value Date/Time   WBC 3.2 (L) 06/13/2021 0825   WBC 5.7 11/23/2006 0325   RBC 3.32 (L) 06/13/2021 0825   HGB 9.7 (L) 06/13/2021 0825   HCT 29.6 (L) 06/13/2021 0825   PLT 227 06/13/2021 0825   MCV 89.2 06/13/2021 0825   MCH 29.2 06/13/2021 0825   MCHC 32.8 06/13/2021 0825   RDW 17.3 (H) 06/13/2021 0825   LYMPHSABS 1.1 06/13/2021 0825   MONOABS 0.3 06/13/2021 0825   EOSABS 0.1 06/13/2021 0825   BASOSABS 0.0 06/13/2021 0825    CMP     Component Value Date/Time   NA 142 06/13/2021 0825   K 3.6 06/13/2021 0825   CL 107 06/13/2021 0825   CO2 28 06/13/2021 0825   GLUCOSE 101 (H) 06/13/2021 0825   BUN 19 06/13/2021 0825    CREATININE 0.87 06/13/2021 0825   CALCIUM 9.2 06/13/2021 0825   PROT 7.7 06/13/2021 0825   ALBUMIN 3.9 06/13/2021 0825   AST 28 06/13/2021 0825   ALT 23 06/13/2021 0825   ALKPHOS 83 06/13/2021 0825   BILITOT 0.8 06/13/2021 0825   GFRNONAA >60 06/13/2021 0825      ASSESSMENT and THERAPY PLAN:   Malignant neoplasm of upper-outer quadrant of left breast in female, estrogen receptor negative ([place]) Amrita is a 56 year old woman with 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  01/2021: Left upper outer quadrant biopsy shows grade 3, 3 cm invasive ductal carcinoma triple negative.  Treatment Plan:  1. Neoadjuvant chemotherapy with pembrolizumab, carboplatin, Taxol followed by pembrolizumab, Adriamycin, Cytoxan. 2. Followed by breast conserving surgery with sentinel lymph node study 3. Followed by adjuvant radiation therapy No role for antiestrogen therapy since this is  a triple negative breast cancer 4. Adjuvant immunotherapy for 9 cycles.  She continues to have itching, random episodes, no clear aggravating or relieving factors. Today we have discussed about omitting taxol and monitor. If she still has ongoing itching despite omitting taxol, then we can consider holding Keytruda. She is agreeable to this plan> I have also recommended gabapentin for neurogenic pruritus Total encounter time: 30 minutes in face-to-face visit time, chart review, lab review, care coordination, order entry, and documentation of the encounter.  *Total Encounter Time as defined by the Centers for Medicare and Medicaid Services includes, in addition to the face-to-face time of a patient visit (documented in the note above) non-face-to-face time: obtaining and reviewing outside history, ordering and reviewing medications, tests or procedures, care coordination (communications with other health care professionals or caregivers) and  documentation in the medical record.

## 2021-06-13 NOTE — Patient Instructions (Signed)
[facility] MEDICAL ONCOLOGY  Discharge Instructions: Thank you for choosing [facility] to provide your oncology and hematology care.   If you have a lab appointment with the Cancer Center, please go directly to the Cancer Center and check in at the registration area.   Wear comfortable clothing and clothing appropriate for easy access to any Portacath or PICC line.   We strive to give you quality time with your provider. You may need to reschedule your appointment if you arrive late (15 or more minutes).  Arriving late affects you and other patients whose appointments are after yours.  Also, if you miss three or more appointments without notifying the office, you may be dismissed from the clinic at the provider's discretion.      For prescription refill requests, have your pharmacy contact our office and allow 72 hours for refills to be completed.    Today you received the following chemotherapy and/or immunotherapy agents carboplatin      To help prevent nausea and vomiting after your treatment, we encourage you to take your nausea medication as directed.  BELOW ARE SYMPTOMS THAT SHOULD BE REPORTED IMMEDIATELY: *FEVER GREATER THAN 100.4 F (38 C) OR HIGHER *CHILLS OR SWEATING *NAUSEA AND VOMITING THAT IS NOT CONTROLLED WITH YOUR NAUSEA MEDICATION *UNUSUAL SHORTNESS OF BREATH *UNUSUAL BRUISING OR BLEEDING *URINARY PROBLEMS (pain or burning when urinating, or frequent urination) *BOWEL PROBLEMS (unusual diarrhea, constipation, pain near the anus) TENDERNESS IN MOUTH AND THROAT WITH OR WITHOUT PRESENCE OF ULCERS (sore throat, sores in mouth, or a toothache) UNUSUAL RASH, SWELLING OR PAIN  UNUSUAL VAGINAL DISCHARGE OR ITCHING   Items with * indicate a potential emergency and should be followed up as soon as possible or go to the Emergency Department if any problems should occur.  Please show the CHEMOTHERAPY ALERT CARD or IMMUNOTHERAPY ALERT CARD at check-in to  the Emergency Department and triage nurse.  Should you have questions after your visit or need to cancel or reschedule your appointment, please contact [facility] MEDICAL ONCOLOGY  Dept: [phone]  and follow the prompts.  Office hours are 8:00 a.m. to 4:30 p.m. Monday - Friday. Please note that voicemails left after 4:00 p.m. may not be returned until the following business day.  We are closed weekends and major holidays. You have access to a nurse at all times for urgent questions. Please call the main number to the clinic Dept: [phone] and follow the prompts.   For any non-urgent questions, you may also contact your provider using MyChart. We now offer e-Visits for anyone 18 and older to request care online for non-urgent symptoms. For details visit [email or web address].   Also download the MyChart app! Go to the app store, search "MyChart", open the app, select [facility], and log in with your MyChart username and password.  Due to Covid, a mask is required upon entering the hospital/clinic. If you do not have a mask, one will be given to you upon arrival. For doctor visits, patients may have 1 support person aged 18 or older with them. For treatment visits, patients cannot have anyone with them due to current Covid guidelines and our immunocompromised population.   

## 2021-06-14 LAB — T4: T4, Total: 6.6 ug/dL (ref 4.5–12.0)

## 2021-06-20 ENCOUNTER — Telehealth: Payer: Self-pay | Admitting: *Deleted

## 2021-06-20 ENCOUNTER — Inpatient Hospital Stay: Payer: No Typology Code available for payment source | Admitting: Hematology and Oncology

## 2021-06-20 ENCOUNTER — Inpatient Hospital Stay: Payer: No Typology Code available for payment source

## 2021-06-20 ENCOUNTER — Ambulatory Visit: Payer: No Typology Code available for payment source

## 2021-06-20 ENCOUNTER — Encounter: Payer: Self-pay | Admitting: *Deleted

## 2021-06-20 NOTE — Telephone Encounter (Signed)
Attempted to call patient about the cancellation of her appts. Her VM was full and I was unable to leave a message.

## 2021-06-24 ENCOUNTER — Telehealth: Payer: Self-pay | Admitting: *Deleted

## 2021-06-24 ENCOUNTER — Encounter: Payer: Self-pay | Admitting: *Deleted

## 2021-06-24 NOTE — Telephone Encounter (Signed)
Attempted to call patient again regarding appts. VM stated it was full. Unable to leave VM. Schedule message sent to r/s patient's appts from 5/26

## 2021-06-25 ENCOUNTER — Telehealth: Payer: Self-pay | Admitting: Hematology and Oncology

## 2021-06-25 ENCOUNTER — Encounter: Payer: Self-pay | Admitting: *Deleted

## 2021-06-25 NOTE — Telephone Encounter (Signed)
Called pt to r/s per 5/30 inbasket, pt informed me she would call back to r/s

## 2021-06-27 ENCOUNTER — Telehealth: Payer: Self-pay | Admitting: *Deleted

## 2021-06-27 NOTE — Telephone Encounter (Signed)
Patient left a message on my VM regarding an appt to talk with Dr. Chryl Heck.  Returned patients call but had to leave VM that I would have one of our schedulers call her to schedule her appt.

## 2021-06-30 ENCOUNTER — Encounter: Payer: Self-pay | Admitting: *Deleted

## 2021-06-30 ENCOUNTER — Encounter: Payer: Self-pay | Admitting: Hematology and Oncology

## 2021-06-30 ENCOUNTER — Inpatient Hospital Stay
Payer: No Typology Code available for payment source | Attending: Hematology and Oncology | Admitting: Hematology and Oncology

## 2021-06-30 ENCOUNTER — Inpatient Hospital Stay: Payer: No Typology Code available for payment source

## 2021-06-30 ENCOUNTER — Other Ambulatory Visit: Payer: Self-pay

## 2021-06-30 DIAGNOSIS — Z9221 Personal history of antineoplastic chemotherapy: Secondary | ICD-10-CM | POA: Diagnosis not present

## 2021-06-30 DIAGNOSIS — F458 Other somatoform disorders: Secondary | ICD-10-CM | POA: Diagnosis not present

## 2021-06-30 DIAGNOSIS — C50412 Malignant neoplasm of upper-outer quadrant of left female breast: Secondary | ICD-10-CM | POA: Diagnosis not present

## 2021-06-30 DIAGNOSIS — Z5111 Encounter for antineoplastic chemotherapy: Secondary | ICD-10-CM | POA: Diagnosis present

## 2021-06-30 DIAGNOSIS — Z171 Estrogen receptor negative status [ER-]: Secondary | ICD-10-CM | POA: Diagnosis not present

## 2021-06-30 NOTE — Progress Notes (Signed)
[place] [facility] Follow up:    [facility], [facility] [employer], [address] [place] [address]   DIAGNOSIS:  Cancer Staging  Malignant neoplasm of upper-outer quadrant of left breast in female, estrogen receptor negative ([place]) Staging form: Breast, AJCC 8th Edition - Clinical stage from 03/07/2021: Stage IIB (cT2, cN0, cM0, G3, ER-, PR-, HER2-) - Signed by Nicholas Lose, MD on 03/07/2021 Stage prefix: Initial diagnosis Histologic grading system: 3 grade system   SUMMARY OF ONCOLOGIC HISTORY: Oncology History  Malignant neoplasm of upper-outer quadrant of left breast in female, estrogen receptor negative ([place])  02/12/2021 Initial Diagnosis   palpable solid mass in the left upper outer quadrant. Diagnostic mammogram on 02/12/2021 showed a 3 cm mass in the left upper outer quadrant. Biopsy on 02/20/2021 showed grade 3 invasive ductal carcinoma, ER/PR/Her2-([facility])   03/07/2021 Cancer Staging   Staging form: Breast, AJCC 8th Edition - Clinical stage from 03/07/2021: Stage IIB (cT2, cN0, cM0, G3, ER-, PR-, HER2-) - Signed by Nicholas Lose, MD on 03/07/2021 Stage prefix: Initial diagnosis Histologic grading system: 3 grade system    03/28/2021 -  Chemotherapy   Patient is on Treatment Plan : BREAST Pembrolizumab (200) D1 + Carboplatin (1.5) D1,8,15 + Paclitaxel (80) D1,8,15 q21d X 4 cycles / Pembrolizumab (200) D1 + AC D1 q21d x 4 cycles       CURRENT THERAPY: Taxol, Carbo, Pembrolizumab  INTERVAL HISTORY:  Amanda Smith 56 y.o. female returns for evaluation prior to receiving her next cycle of neoadjuvant chemotherapy.   Baseline ECHO March 21, 2021 and showed a normal left ventricular ejection fraction of 60 to 65%. She cancelled her last infusion appointment because she didn't feel well She says her itching was so much better since she didn't take chemotherapy. She feels remarkably well. She is not sure if she wanted to do more chemotherapy but  she will think about it. She doesn't want to proceed with infusion today.  She says she didn't schedule it. No other complaints today. Rest of the pertinent 10 point ROS reviewed and negative.  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  MEDICAL HISTORY: Past Medical History:  Diagnosis Date   Breast cancer ([facility])     SURGICAL HISTORY: Past Surgical History:  Procedure Laterality Date   CESAREAN SECTION     PORTACATH PLACEMENT Right 03/18/2021   Procedure: INSERTION PORT-A-CATH;  Surgeon: Rolm Bookbinder, MD;  Location: [facility];  Service: General;  Laterality: Right;   WRIST FRACTURE SURGERY      SOCIAL HISTORY: Social History   Socioeconomic History   Marital status: Married    Spouse name: Not on file   Number of children: Not on file   Years of education: Not on file   Highest education level: Not on file  Occupational History   Not on file  Tobacco Use   Smoking status: Never   Smokeless tobacco: Never  Vaping Use   Vaping Use: Never used  Substance and Sexual Activity   Alcohol use: Never   Drug use: Never   Sexual activity: Yes    Birth control/protection: Post-menopausal  Other Topics Concern   Not on file  Social History Narrative   Not on file   Social Determinants of  Health   Financial Resource Strain: Not on file  Food Insecurity: Not on file  Transportation Needs: Not on file  Physical Activity: Not on file  Stress: Not on file  Social Connections: Not on file  Intimate Partner Violence: Not on file    FAMILY HISTORY: Family History  Problem Relation Age of Onset   Breast cancer Sister     Review of Systems  Constitutional:  Negative for appetite change, chills, fatigue, fever and  unexpected weight change.  HENT:   Negative for hearing loss, lump/mass and trouble swallowing.   Eyes:  Negative for eye problems and icterus.  Respiratory:  Negative for chest tightness, cough and shortness of breath.   Cardiovascular:  Negative for chest pain, leg swelling and palpitations.  Gastrointestinal:  Negative for abdominal distention, abdominal pain, constipation, diarrhea, nausea and vomiting.  Endocrine: Negative for hot flashes.  Genitourinary:  Negative for difficulty urinating.   Musculoskeletal:  Negative for arthralgias.  Skin:  Positive for itching. Negative for rash.  Neurological:  Negative for dizziness, extremity weakness, headaches and numbness.  Hematological:  Negative for adenopathy. Does not bruise/bleed easily.  Psychiatric/Behavioral:  Negative for depression. The patient is not nervous/anxious.      PHYSICAL EXAMINATION  ECOG PERFORMANCE STATUS: 1 - Symptomatic but completely ambulatory  Vitals:   06/30/21 1139  BP: 117/65  Pulse: 90  Resp: 16  Temp: 98.1 F (36.7 C)  SpO2: 100%    Physical Exam Constitutional:      Appearance: Normal appearance.  Eyes:     General: No scleral icterus. Chest:     Comments: Left breast mass measuring about 2 * 2 cms. No palpable regional adenopathy Musculoskeletal:     Cervical back: Normal range of motion. No rigidity.  Lymphadenopathy:     Cervical: No cervical adenopathy.  Skin:    General: Skin is warm and dry.  Neurological:     Mental Status: She is alert.    LABORATORY DATA:  CBC    Component Value Date/Time   WBC 3.2 (L) 06/13/2021 0825   WBC 5.7 11/23/2006 0325   RBC 3.32 (L) 06/13/2021 0825   HGB 9.7 (L) 06/13/2021 0825   HCT 29.6 (L) 06/13/2021 0825   PLT 227 06/13/2021 0825   MCV 89.2 06/13/2021 0825   MCH 29.2 06/13/2021 0825   MCHC 32.8 06/13/2021 0825   RDW 17.3 (H) 06/13/2021 0825   LYMPHSABS 1.1 06/13/2021 0825   MONOABS 0.3 06/13/2021 0825   EOSABS 0.1 06/13/2021 0825    BASOSABS 0.0 06/13/2021 0825    CMP     Component Value Date/Time   NA 142 06/13/2021 0825   K 3.6 06/13/2021 0825   CL 107 06/13/2021 0825   CO2 28 06/13/2021 0825   GLUCOSE 101 (H) 06/13/2021 0825   BUN 19 06/13/2021 0825   CREATININE 0.87 06/13/2021 0825   CALCIUM 9.2 06/13/2021 0825   PROT 7.7 06/13/2021 0825   ALBUMIN 3.9 06/13/2021 0825   AST 28 06/13/2021 0825   ALT 23 06/13/2021 0825   ALKPHOS 83 06/13/2021 0825   BILITOT 0.8 06/13/2021 0825   GFRNONAA >60 06/13/2021 0825      ASSESSMENT and THERAPY PLAN:   Malignant neoplasm of upper-outer quadrant of left breast in female, estrogen receptor negative ([place]) Amanda Smith is a 56 year old woman with 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  01/2021: Left upper outer quadrant biopsy shows grade 3, 3 cm invasive ductal  carcinoma triple negative.  Treatment Plan:  1. Neoadjuvant chemotherapy with pembrolizumab, carboplatin, Taxol followed by pembrolizumab, Adriamycin, Cytoxan. 2. Followed by breast conserving surgery with sentinel lymph node study 3. Followed by adjuvant radiation therapy No role for antiestrogen therapy since this is a triple negative breast cancer 4. Adjuvant immunotherapy for 9 cycles.  She completed C4D8 and cancelled her last treatment of carbotaxol. She says the itching is tremendously better since she didn't receive chemotherapy.  She is not sure if she wants to proceed with chemo. 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  She wants to think about this. She understands the benefits and risks of chemo. If she doesn't proceed with chemotherapy, then she will move forward with definitive surgery and hopefully adjuvant immunotherapy. I think besides the pruritus, she handled chemo well, hence recommended moving forward with chemo as  planned.  Total encounter time: 30 minutes in face-to-face visit time, chart review, lab review, care coordination, order entry, and documentation of the encounter.  *Total Encounter Time as defined by the Centers for Medicare and Medicaid Services includes, in addition to the face-to-face time of a patient visit (documented in the note above) non-face-to-face time: obtaining and reviewing outside history, ordering and reviewing medications, tests or procedures, care coordination (communications with other health care professionals or caregivers) and documentation in the medical record.

## 2021-06-30 NOTE — Assessment & Plan Note (Addendum)
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  01/2021: Left upper outer quadrant biopsy shows grade 3, 3 cm invasive ductal carcinoma triple negative.  Treatment Plan:  1. Neoadjuvant chemotherapy with pembrolizumab, carboplatin, Taxol followed by pembrolizumab, Adriamycin, Cytoxan. 2. Followed by breast conserving surgery with sentinel lymph node study 3. Followed by adjuvant radiation therapy No role for antiestrogen therapy since this is a triple negative breast cancer 4. Adjuvant immunotherapy for 9 cycles.  She completed C4D8 and cancelled her last treatment of carbotaxol. She says the itching is tremendously better since she didn't receive chemotherapy.  She is not sure if she wants to proceed with chemo. 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  She wants to think about this. She understands the benefits and risks of chemo. If she doesn't proceed with chemotherapy, then she will move forward with definitive surgery and hopefully adjuvant immunotherapy. I think besides the pruritus, she handled chemo well, hence recommended moving forward with chemo as planned.

## 2021-07-01 ENCOUNTER — Telehealth: Payer: Self-pay | Admitting: *Deleted

## 2021-07-01 NOTE — Telephone Encounter (Signed)
This RN called pt to follow up on plan for scheduling of chemotherapy if pt wants to proceed.  ( Note when scheduling calls -pt has declined scheduling ).  Obtained identified VM- message left requesting pt to return call to this RN to discuss how she wants to proceed so we can schedule appropriately either for Chemo or to see the surgeon.  This RN's name given for return call.

## 2021-07-02 ENCOUNTER — Telehealth: Payer: Self-pay | Admitting: *Deleted

## 2021-07-02 ENCOUNTER — Encounter: Payer: Self-pay | Admitting: *Deleted

## 2021-07-02 NOTE — Telephone Encounter (Signed)
Verified msg sent to pt requesting response regarding continuing chemo vs moving on to surgery. Request return msg.

## 2021-07-02 NOTE — Telephone Encounter (Signed)
This RN spoke with pt per return call stating she received my message about what is needed to do now.  Conversation entailed discussing how she is dealing with everything- and how she is "processing" her choices.  She states she does feel " I am done " ( what her spirit is saying to her ) and she feels at peace saying she is done with chemo and now needs to proceed to the next step.  This RN validated above as well as explaining need to proceed now to surgery especially since she has not had chemo for 2 weeks. She verbalized understanding that prolonging doing surgery could medically allow for tumor to grow.  She understands and has an appt with Dr Donne Hazel next week. This RN answered questions as best as possible about that appointment.  She understands per this discussion that at the appointment next week  Dr Donne Hazel will discuss what he needs to do per surgery for best outcome.   She understands per call that she will need to allocate non work days for surgery and recovery.  This RN validated pt in her decisions with goal of this office to help her during this process.  Note conversation entailed allowing the pt to speak freely and at time at length about how she "hears" what she needs to do and how she "processes" and then is able to make decisions.  Pt stated at end of discussion she felt at peace about above plan - and that she is very appreciative of the care she is being given by the team.

## 2021-07-03 ENCOUNTER — Other Ambulatory Visit: Payer: Self-pay | Admitting: *Deleted

## 2021-07-03 DIAGNOSIS — Z171 Estrogen receptor negative status [ER-]: Secondary | ICD-10-CM

## 2021-07-08 ENCOUNTER — Encounter: Payer: Self-pay | Admitting: *Deleted

## 2021-07-11 ENCOUNTER — Ambulatory Visit
Admission: RE | Admit: 2021-07-11 | Discharge: 2021-07-11 | Disposition: A | Discharge status: Home / Self Care | Payer: No Typology Code available for payment source | Source: Ambulatory Visit | Attending: Hematology and Oncology | Admitting: Hematology and Oncology

## 2021-07-11 DIAGNOSIS — Z171 Estrogen receptor negative status [ER-]: Secondary | ICD-10-CM

## 2021-07-11 MED ORDER — GADOBUTROL 1 MMOL/ML IV SOLN
5.0000 mL | Freq: Once | INTRAVENOUS | Status: AC | PRN
  Administered 2021-07-11: 5 mL via INTRAVENOUS

## 2021-07-14 ENCOUNTER — Encounter: Payer: Self-pay | Admitting: *Deleted

## 2021-08-01 ENCOUNTER — Other Ambulatory Visit: Payer: Self-pay | Admitting: General Surgery

## 2021-08-07 ENCOUNTER — Other Ambulatory Visit: Payer: Self-pay | Admitting: *Deleted

## 2021-08-07 DIAGNOSIS — C50412 Malignant neoplasm of upper-outer quadrant of left female breast: Secondary | ICD-10-CM

## 2021-08-08 ENCOUNTER — Telehealth: Payer: Self-pay | Admitting: Radiation Oncology

## 2021-08-08 NOTE — Telephone Encounter (Signed)
Called patient to schedule a consultation w. Dr. Isidore Moos. No answer, unable to LVM, VM is not setup.

## 2021-08-11 ENCOUNTER — Encounter: Payer: Self-pay | Admitting: *Deleted

## 2021-08-11 ENCOUNTER — Telehealth: Payer: Self-pay | Admitting: Radiation Oncology

## 2021-08-11 NOTE — Telephone Encounter (Signed)
Called patient to schedule a follow up appointment w. Dr. Isidore Moos. No answer, unable to LVM, VM is not setup.

## 2021-08-12 ENCOUNTER — Telehealth: Payer: Self-pay | Admitting: Radiation Oncology

## 2021-08-12 NOTE — Telephone Encounter (Signed)
Called patient to schedule a follow up appointment w. Dr. Squire. No answer, LVM for a return call. 

## 2021-08-13 ENCOUNTER — Telehealth: Payer: Self-pay | Admitting: Radiation Oncology

## 2021-08-13 NOTE — Telephone Encounter (Signed)
Called patient to schedule a follow up appointment w. Dr. Squire. No answer, LVM for a return call. 

## 2021-08-14 ENCOUNTER — Telehealth: Payer: Self-pay | Admitting: Radiation Oncology

## 2021-08-14 NOTE — Telephone Encounter (Signed)
Sent unable to contact letter 7/20.

## 2021-08-14 NOTE — Telephone Encounter (Signed)
Called patient to schedule a follow up with Dr. Isidore Moos. No answer, LVM for a return call.

## 2021-08-18 ENCOUNTER — Other Ambulatory Visit: Payer: Self-pay

## 2021-08-20 ENCOUNTER — Telehealth: Payer: Self-pay | Admitting: Hematology and Oncology

## 2021-08-20 NOTE — Telephone Encounter (Signed)
.  Called patient to schedule appointment per 7/15 inbasket, patient is aware of date and time.

## 2021-08-21 ENCOUNTER — Encounter: Payer: Self-pay | Admitting: Hematology and Oncology

## 2021-08-21 NOTE — Progress Notes (Signed)
Surgical Instructions    Your procedure is scheduled on Tuesday August 1.  Report to [facility] Main Entrance "A" at 10 A.M., then check in with the Admitting office.  Call this number if you have problems the morning of surgery:  [phone]   If you have any questions prior to your surgery date call [phone]: Open Monday-Friday 8am-4pm    Remember:  Do not eat after midnight the night before your surgery  You may drink clear liquids until 9:00am the morning of your surgery.   Clear liquids allowed are: Water, Non-Citrus Juices (without pulp), Carbonated Beverages, Clear Tea, Black Coffee ONLY (NO MILK, CREAM OR POWDERED CREAMER of any kind), and Gatorade  The day of surgery (if you do NOT have diabetes):  Drink ONE (1) Pre-Surgery Clear Ensure by 9:00am the morning of surgery. Drink in one sitting. Do not sip.  This drink was given to you during your hospital  pre-op appointment visit.  Nothing else to drink after completing the  Pre-Surgery Clear Ensure.        If you have questions, please contact your surgeon's office.      Take these medicines the morning of surgery with A SIP OF WATER: NONE   As of today, STOP taking any Aspirin (unless otherwise instructed by your surgeon) Aleve, Naproxen, Ibuprofen, Motrin, Advil, Goody's, BC's, all herbal medications, fish oil, and all vitamins.           Do not wear jewelry or makeup Do not wear lotions, powders, perfumes/colognes, or deodorant. Do not shave 48 hours prior to surgery.  Men may shave face and neck. Do not bring valuables to the hospital. Do not wear nail polish, gel polish, artificial nails, or any other type of covering on natural nails (fingers and toes) If you have artificial nails or gel coating that need to be removed by a nail salon, please have this removed prior to surgery. Artificial nails or gel coating may interfere with anesthesia's ability to adequately monitor your vital signs.  [facility] is not  responsible for any belongings or valuables. .   Do NOT Smoke (Tobacco/Vaping)  24 hours prior to your procedure  If you use a CPAP at night, you may bring your mask for your overnight stay.   Contacts, glasses, hearing aids, dentures or partials may not be worn into surgery, please bring cases for these belongings   For patients admitted to the hospital, discharge time will be determined by your treatment team.   Patients discharged the day of surgery will not be allowed to drive home, and someone needs to stay with them for 24 hours.   SURGICAL WAITING ROOM VISITATION Patients having surgery or a procedure may have no more than 2 support people in the waiting area - these visitors may rotate.   Children under the age of 32 must have an adult with them who is not the patient. If the patient needs to stay at the hospital during part of their recovery, the visitor guidelines for inpatient rooms apply. Pre-op nurse will coordinate an appropriate time for 1 support person to accompany patient in pre-op.  This support person may not rotate.   Please refer to the [facility] website for the visitor guidelines for Inpatients (after your surgery is over and you are in a regular room).    Special instructions:    Oral Hygiene is also important to reduce your risk of infection.  Remember - BRUSH YOUR TEETH THE MORNING OF SURGERY  WITH YOUR REGULAR TOOTHPASTE   [facility]- Preparing For Surgery  Before surgery, you can play an important role. Because skin is not sterile, your skin needs to be as free of germs as possible. You can reduce the number of germs on your skin by washing with CHG (chlorahexidine gluconate) Soap before surgery.  CHG is an antiseptic cleaner which kills germs and bonds with the skin to continue killing germs even after washing.     Please do not use if you have an allergy to CHG or antibacterial soaps. If your skin becomes reddened/irritated stop using the CHG.  Do not  shave (including legs and underarms) for at least 48 hours prior to first CHG shower. It is OK to shave your face.  Please follow these instructions carefully.     Shower the NIGHT BEFORE SURGERY and the MORNING OF SURGERY with CHG Soap.   If you chose to wash your hair, wash your hair first as usual with your normal shampoo. After you shampoo, rinse your hair and body thoroughly to remove the shampoo.  Then [employer] and genitals (private parts) with your normal soap and rinse thoroughly to remove soap.  After that Use CHG Soap as you would any other liquid soap. You can apply CHG directly to the skin and wash gently with a scrungie or a clean washcloth.   Apply the CHG Soap to your body ONLY FROM THE NECK DOWN.  Do not use on open wounds or open sores. Avoid contact with your eyes, ears, mouth and genitals (private parts). Wash Face and genitals (private parts)  with your normal soap.   Wash thoroughly, paying special attention to the area where your surgery will be performed.  Thoroughly rinse your body with warm water from the neck down.  DO NOT shower/wash with your normal soap after using and rinsing off the CHG Soap.  Pat yourself dry with a CLEAN TOWEL.  Wear CLEAN PAJAMAS to bed the night before surgery  Place CLEAN SHEETS on your bed the night before your surgery  DO NOT SLEEP WITH PETS.   Day of Surgery:  Take a shower with CHG soap. Wear Clean/Comfortable clothing the morning of surgery Do not apply any deodorants/lotions.   Remember to brush your teeth WITH YOUR REGULAR TOOTHPASTE.    If you received a COVID test during your pre-op visit, it is requested that you wear a mask when out in public, stay away from anyone that may not be feeling well, and notify your surgeon if you develop symptoms. If you have been in contact with anyone that has tested positive in the last 10 days, please notify your surgeon.    Please read over the following fact sheets that you were  given.

## 2021-08-22 ENCOUNTER — Encounter (HOSPITAL_COMMUNITY)
Admission: RE | Admit: 2021-08-22 | Discharge: 2021-08-22 | Disposition: A | Discharge status: Home / Self Care | Payer: Self-pay | Source: Ambulatory Visit | Attending: General Surgery | Admitting: General Surgery

## 2021-08-22 ENCOUNTER — Encounter (HOSPITAL_COMMUNITY): Payer: Self-pay

## 2021-08-22 ENCOUNTER — Other Ambulatory Visit: Payer: Self-pay

## 2021-08-22 VITALS — BP 109/66 | HR 66 | Temp 97.8°F | Resp 17 | Ht 61.0 in | Wt 116.4 lb

## 2021-08-22 DIAGNOSIS — Z01818 Encounter for other preprocedural examination: Secondary | ICD-10-CM

## 2021-08-22 DIAGNOSIS — Z01812 Encounter for preprocedural laboratory examination: Secondary | ICD-10-CM | POA: Insufficient documentation

## 2021-08-22 LAB — BASIC METABOLIC PANEL
Anion gap: 6 (ref 5–15)
BUN: 14 mg/dL (ref 6–20)
CO2: 29 mmol/L (ref 22–32)
Calcium: 9.4 mg/dL (ref 8.9–10.3)
Chloride: 104 mmol/L (ref 98–111)
Creatinine, Ser: 0.99 mg/dL (ref 0.44–1.00)
GFR, Estimated: >60 mL/min (ref >60)
Glucose, Bld: 100 mg/dL — ABNORMAL HIGH (ref 70–99)
Potassium: 4.4 mmol/L (ref 3.5–5.1)
Sodium: 139 mmol/L (ref 135–145)

## 2021-08-22 LAB — CBC
HCT: 35.5 % — ABNORMAL LOW (ref 36.0–46.0)
Hemoglobin: 11.3 g/dL — ABNORMAL LOW (ref 12.0–15.0)
MCH: 28.9 pg (ref 26.0–34.0)
MCHC: 31.8 g/dL (ref 30.0–36.0)
MCV: 90.8 fL (ref 80.0–100.0)
Platelets: 170 10*3/uL (ref 150–400)
RBC: 3.91 MIL/uL (ref 3.87–5.11)
RDW: 13.7 % (ref 11.5–15.5)
WBC: 3.6 10*3/uL — ABNORMAL LOW (ref 4.0–10.5)
nRBC: 0 % (ref 0.0–0.2)

## 2021-08-22 NOTE — Progress Notes (Signed)
PCP - [facility] Cardiologist - none  PPM/ICD - denies Device Orders -  Rep Notified -   Chest x-ray - none EKG - none Stress Test - none ECHO - 03/21/21 Cardiac Cath - none  Sleep Study - denies CPAP - no  Fasting Blood Sugar - na Checks Blood Sugar _____ times a day  Blood Thinner Instructions:na Aspirin Instructions:na  ERAS Protcol -clear liquids until 0900 PRE-SURGERY Ensure or G2- Ensure   COVID TEST- na   Anesthesia review: no  Patient denies shortness of breath, fever, cough and chest pain at PAT appointment   All instructions explained to the patient, with a verbal understanding of the material. Patient agrees to go over the instructions while at home for a better understanding. Patient also instructed to wear a mask when out in public prior to surgery. The opportunity to ask questions was provided.

## 2021-08-25 ENCOUNTER — Other Ambulatory Visit: Payer: Self-pay

## 2021-08-26 ENCOUNTER — Ambulatory Visit (HOSPITAL_COMMUNITY): Payer: Self-pay | Admitting: Certified Registered Nurse Anesthetist

## 2021-08-26 ENCOUNTER — Ambulatory Visit (HOSPITAL_COMMUNITY)
Admission: RE | Admit: 2021-08-26 | Discharge: 2021-08-26 | Disposition: A | Discharge status: Home / Self Care | Payer: Self-pay | Attending: General Surgery | Admitting: General Surgery

## 2021-08-26 ENCOUNTER — Encounter (HOSPITAL_COMMUNITY): Payer: Self-pay | Admitting: General Surgery

## 2021-08-26 ENCOUNTER — Encounter (HOSPITAL_COMMUNITY)
Admission: RE | Disposition: A | Discharge status: Home / Self Care | Payer: Self-pay | Source: Home / Self Care | Attending: General Surgery

## 2021-08-26 ENCOUNTER — Ambulatory Visit (HOSPITAL_BASED_OUTPATIENT_CLINIC_OR_DEPARTMENT_OTHER): Payer: Self-pay | Admitting: Certified Registered Nurse Anesthetist

## 2021-08-26 ENCOUNTER — Other Ambulatory Visit: Payer: Self-pay

## 2021-08-26 DIAGNOSIS — Z171 Estrogen receptor negative status [ER-]: Secondary | ICD-10-CM | POA: Insufficient documentation

## 2021-08-26 DIAGNOSIS — C50412 Malignant neoplasm of upper-outer quadrant of left female breast: Secondary | ICD-10-CM | POA: Insufficient documentation

## 2021-08-26 DIAGNOSIS — C50912 Malignant neoplasm of unspecified site of left female breast: Secondary | ICD-10-CM

## 2021-08-26 DIAGNOSIS — C773 Secondary and unspecified malignant neoplasm of axilla and upper limb lymph nodes: Secondary | ICD-10-CM | POA: Insufficient documentation

## 2021-08-26 HISTORY — PX: BREAST LUMPECTOMY WITH SENTINEL LYMPH NODE BIOPSY: SHX5597

## 2021-08-26 HISTORY — PX: AXILLARY SENTINEL NODE BIOPSY: SHX5738

## 2021-08-26 SURGERY — BREAST LUMPECTOMY WITH SENTINEL LYMPH NODE BX
Anesthesia: General | Site: Breast | Laterality: Left

## 2021-08-26 MED ORDER — MAGTRACE LYMPHATIC TRACER
INTRAMUSCULAR | Status: DC | PRN
  Administered 2021-08-26: 2 mL via INTRAMUSCULAR

## 2021-08-26 MED ORDER — FENTANYL CITRATE (PF) 250 MCG/5ML IJ SOLN
INTRAMUSCULAR | Status: AC
  Filled 2021-08-26: qty 5

## 2021-08-26 MED ORDER — HYDROMORPHONE HCL 1 MG/ML IJ SOLN
0.2500 mg | INTRAMUSCULAR | Status: DC | PRN
  Administered 2021-08-26: 0.25 mg via INTRAVENOUS

## 2021-08-26 MED ORDER — MIDAZOLAM HCL 2 MG/2ML IJ SOLN: 2.0000 mg | Freq: Once | INTRAMUSCULAR | Status: AC

## 2021-08-26 MED ORDER — DEXAMETHASONE SODIUM PHOSPHATE 10 MG/ML IJ SOLN
INTRAMUSCULAR | Status: AC
  Filled 2021-08-26: qty 1

## 2021-08-26 MED ORDER — ARTIFICIAL TEARS OPHTHALMIC OINT
TOPICAL_OINTMENT | OPHTHALMIC | Status: AC
  Filled 2021-08-26: qty 3.5

## 2021-08-26 MED ORDER — OXYCODONE HCL 5 MG PO TABS: 5.0000 mg | ORAL_TABLET | Freq: Once | ORAL | Status: DC | PRN

## 2021-08-26 MED ORDER — BUPIVACAINE LIPOSOME 1.3 % IJ SUSP: INTRAMUSCULAR | Status: DC | PRN

## 2021-08-26 MED ORDER — 0.9 % SODIUM CHLORIDE (POUR BTL) OPTIME
TOPICAL | Status: DC | PRN
  Administered 2021-08-26: 1000 mL

## 2021-08-26 MED ORDER — METHYLENE BLUE 1 % INJ SOLN
INTRAVENOUS | Status: AC
  Filled 2021-08-26: qty 10

## 2021-08-26 MED ORDER — ORAL CARE MOUTH RINSE: 15.0000 mL | Freq: Once | OROMUCOSAL | Status: AC

## 2021-08-26 MED ORDER — FENTANYL CITRATE (PF) 100 MCG/2ML IJ SOLN
INTRAMUSCULAR | Status: AC
  Administered 2021-08-26: 50 ug via INTRAVENOUS
  Filled 2021-08-26: qty 2

## 2021-08-26 MED ORDER — CHLORHEXIDINE GLUCONATE CLOTH 2 % EX PADS: 6.0000 | MEDICATED_PAD | Freq: Once | CUTANEOUS | Status: DC

## 2021-08-26 MED ORDER — CHLORHEXIDINE GLUCONATE 0.12 % MT SOLN
OROMUCOSAL | Status: AC
  Administered 2021-08-26: 15 mL via OROMUCOSAL
  Filled 2021-08-26: qty 15

## 2021-08-26 MED ORDER — OXYCODONE HCL 5 MG/5ML PO SOLN: 5.0000 mg | Freq: Once | ORAL | Status: DC | PRN

## 2021-08-26 MED ORDER — ONDANSETRON HCL 4 MG/2ML IJ SOLN: 4.0000 mg | Freq: Once | INTRAMUSCULAR | Status: DC | PRN

## 2021-08-26 MED ORDER — LIDOCAINE 2% (20 MG/ML) 5 ML SYRINGE
INTRAMUSCULAR | Status: DC | PRN
  Administered 2021-08-26: 40 mg via INTRAVENOUS

## 2021-08-26 MED ORDER — BUPIVACAINE LIPOSOME 1.3 % IJ SUSP
INTRAMUSCULAR | Status: DC | PRN
  Administered 2021-08-26: 10 mL via PERINEURAL

## 2021-08-26 MED ORDER — ONDANSETRON HCL 4 MG/2ML IJ SOLN
INTRAMUSCULAR | Status: DC | PRN
  Administered 2021-08-26: 4 mg via INTRAVENOUS

## 2021-08-26 MED ORDER — SODIUM CHLORIDE (PF) 0.9 % IJ SOLN
INTRAMUSCULAR | Status: AC
Start: 2021-08-26 — End: ?
  Filled 2021-08-26: qty 10

## 2021-08-26 MED ORDER — FENTANYL CITRATE (PF) 250 MCG/5ML IJ SOLN
INTRAMUSCULAR | Status: DC | PRN
Start: 2021-08-26 — End: 2021-08-26
  Administered 2021-08-26: 50 ug via INTRAVENOUS

## 2021-08-26 MED ORDER — ENSURE PRE-SURGERY PO LIQD: 296.0000 mL | Freq: Once | ORAL | Status: DC

## 2021-08-26 MED ORDER — PROPOFOL 10 MG/ML IV BOLUS
INTRAVENOUS | Status: AC
  Filled 2021-08-26: qty 20

## 2021-08-26 MED ORDER — EPHEDRINE SULFATE-NACL 50-0.9 MG/10ML-% IV SOSY
PREFILLED_SYRINGE | INTRAVENOUS | Status: DC | PRN
  Administered 2021-08-26: 10 mg via INTRAVENOUS
  Administered 2021-08-26: 5 mg via INTRAVENOUS
  Administered 2021-08-26: 10 mg via INTRAVENOUS

## 2021-08-26 MED ORDER — ACETAMINOPHEN 500 MG PO TABS: 1000.0000 mg | ORAL_TABLET | ORAL | Status: AC

## 2021-08-26 MED ORDER — BUPIVACAINE HCL (PF) 0.5 % IJ SOLN
INTRAMUSCULAR | Status: DC | PRN
  Administered 2021-08-26: 20 mL via PERINEURAL

## 2021-08-26 MED ORDER — KETOROLAC TROMETHAMINE 15 MG/ML IJ SOLN: 15.0000 mg | INTRAMUSCULAR | Status: AC

## 2021-08-26 MED ORDER — CHLORHEXIDINE GLUCONATE 0.12 % MT SOLN: 15.0000 mL | Freq: Once | OROMUCOSAL | Status: AC

## 2021-08-26 MED ORDER — PROPOFOL 10 MG/ML IV BOLUS
INTRAVENOUS | Status: DC | PRN
  Administered 2021-08-26: 110 mg via INTRAVENOUS

## 2021-08-26 MED ORDER — ACETAMINOPHEN 500 MG PO TABS
ORAL_TABLET | ORAL | Status: AC
  Administered 2021-08-26: 1000 mg via ORAL
  Filled 2021-08-26: qty 2

## 2021-08-26 MED ORDER — SODIUM CHLORIDE (PF) 0.9 % IJ SOLN: INTRAVENOUS | Status: DC | PRN

## 2021-08-26 MED ORDER — HEMOSTATIC AGENTS (NO CHARGE) OPTIME
TOPICAL | Status: DC | PRN
  Administered 2021-08-26: 1 via TOPICAL

## 2021-08-26 MED ORDER — LIDOCAINE 2% (20 MG/ML) 5 ML SYRINGE
INTRAMUSCULAR | Status: AC
  Filled 2021-08-26: qty 5

## 2021-08-26 MED ORDER — CEFAZOLIN SODIUM-DEXTROSE 2-4 GM/100ML-% IV SOLN
2.0000 g | INTRAVENOUS | Status: AC
  Administered 2021-08-26: 2 g via INTRAVENOUS

## 2021-08-26 MED ORDER — CEFAZOLIN SODIUM-DEXTROSE 2-4 GM/100ML-% IV SOLN
INTRAVENOUS | Status: AC
  Filled 2021-08-26: qty 100

## 2021-08-26 MED ORDER — PHENYLEPHRINE 80 MCG/ML (10ML) SYRINGE FOR IV PUSH (FOR BLOOD PRESSURE SUPPORT)
PREFILLED_SYRINGE | INTRAVENOUS | Status: AC
  Filled 2021-08-26: qty 10

## 2021-08-26 MED ORDER — HYDROMORPHONE HCL 1 MG/ML IJ SOLN
INTRAMUSCULAR | Status: AC
  Filled 2021-08-26: qty 1

## 2021-08-26 MED ORDER — ONDANSETRON HCL 4 MG/2ML IJ SOLN
INTRAMUSCULAR | Status: AC
  Filled 2021-08-26: qty 2

## 2021-08-26 MED ORDER — DEXAMETHASONE SODIUM PHOSPHATE 10 MG/ML IJ SOLN
INTRAMUSCULAR | Status: DC | PRN
  Administered 2021-08-26: 4 mg via INTRAVENOUS

## 2021-08-26 MED ORDER — KETOROLAC TROMETHAMINE 15 MG/ML IJ SOLN
INTRAMUSCULAR | Status: AC
  Administered 2021-08-26: 15 mg via INTRAVENOUS
  Filled 2021-08-26: qty 1

## 2021-08-26 MED ORDER — BUPIVACAINE-EPINEPHRINE (PF) 0.25% -1:200000 IJ SOLN
INTRAMUSCULAR | Status: AC
  Filled 2021-08-26: qty 30

## 2021-08-26 MED ORDER — MIDAZOLAM HCL 2 MG/2ML IJ SOLN
INTRAMUSCULAR | Status: AC
  Administered 2021-08-26: 2 mg via INTRAVENOUS
  Filled 2021-08-26: qty 2

## 2021-08-26 MED ORDER — TRAMADOL HCL 50 MG PO TABS: 100.0000 mg | ORAL_TABLET | Freq: Four times a day (QID) | ORAL | 0 refills | Status: AC | PRN

## 2021-08-26 MED ORDER — FENTANYL CITRATE (PF) 100 MCG/2ML IJ SOLN: 50.0000 ug | Freq: Once | INTRAMUSCULAR | Status: AC

## 2021-08-26 MED ORDER — LACTATED RINGERS IV SOLN: INTRAVENOUS | Status: DC

## 2021-08-26 SURGICAL SUPPLY — 52 items
ADH SKN CLS APL DERMABOND .7 (GAUZE/BANDAGES/DRESSINGS) ×2
APL PRP STRL LF DISP 70% ISPRP (MISCELLANEOUS) ×2
APPLIER CLIP 9.375 MED OPEN (MISCELLANEOUS)
APR CLP MED 9.3 20 MLT OPN (MISCELLANEOUS)
BAG COUNTER SPONGE SURGICOUNT (BAG) ×4 IMPLANT
BAG SPNG CNTER NS LX DISP (BAG) ×2
BINDER BREAST LRG (GAUZE/BANDAGES/DRESSINGS) IMPLANT
BINDER BREAST XLRG (GAUZE/BANDAGES/DRESSINGS) IMPLANT
CANISTER SUCT 3000ML PPV (MISCELLANEOUS) ×4 IMPLANT
CHLORAPREP W/TINT 26 (MISCELLANEOUS) ×4 IMPLANT
CLIP APPLIE 9.375 MED OPEN (MISCELLANEOUS) IMPLANT
CLIP VESOCCLUDE MED 6/CT (CLIP) ×4 IMPLANT
CLSR STERI-STRIP ANTIMIC 1/2X4 (GAUZE/BANDAGES/DRESSINGS) ×1 IMPLANT
COVER PROBE W GEL 5X96 (DRAPES) ×4 IMPLANT
COVER SURGICAL LIGHT HANDLE (MISCELLANEOUS) ×4 IMPLANT
DERMABOND ADVANCED (GAUZE/BANDAGES/DRESSINGS) ×1
DERMABOND ADVANCED .7 DNX12 (GAUZE/BANDAGES/DRESSINGS) ×3 IMPLANT
DEVICE DUBIN SPECIMEN MAMMOGRA (MISCELLANEOUS) ×4 IMPLANT
DRAPE CHEST BREAST 15X10 FENES (DRAPES) ×4 IMPLANT
ELECT COATED BLADE 2.86 ST (ELECTRODE) ×4 IMPLANT
ELECT REM PT RETURN 9FT ADLT (ELECTROSURGICAL) ×3
ELECTRODE REM PT RTRN 9FT ADLT (ELECTROSURGICAL) ×3 IMPLANT
GLOVE BIO SURGEON STRL SZ7 (GLOVE) ×4 IMPLANT
GLOVE BIOGEL PI IND STRL 7.5 (GLOVE) ×3 IMPLANT
GLOVE BIOGEL PI INDICATOR 7.5 (GLOVE) ×1
GOWN STRL REUS W/ TWL LRG LVL3 (GOWN DISPOSABLE) ×6 IMPLANT
GOWN STRL REUS W/TWL LRG LVL3 (GOWN DISPOSABLE) ×6
HEMOSTAT ARISTA ABSORB 3G PWDR (HEMOSTASIS) ×1 IMPLANT
ILLUMINATOR WAVEGUIDE N/F (MISCELLANEOUS) IMPLANT
KIT BASIN OR (CUSTOM PROCEDURE TRAY) ×4 IMPLANT
KIT MARKER MARGIN INK (KITS) ×4 IMPLANT
NDL 18GX1X1/2 (RX/OR ONLY) (NEEDLE) IMPLANT
NDL FILTER BLUNT 18X1 1/2 (NEEDLE) IMPLANT
NDL HYPO 25GX1X1/2 BEV (NEEDLE) ×3 IMPLANT
NEEDLE 18GX1X1/2 (RX/OR ONLY) (NEEDLE) IMPLANT
NEEDLE FILTER BLUNT 18X 1/2SAF (NEEDLE)
NEEDLE FILTER BLUNT 18X1 1/2 (NEEDLE) IMPLANT
NEEDLE HYPO 25GX1X1/2 BEV (NEEDLE) ×3 IMPLANT
NS IRRIG 1000ML POUR BTL (IV SOLUTION) ×4 IMPLANT
PACK GENERAL/GYN (CUSTOM PROCEDURE TRAY) ×4 IMPLANT
STRIP CLOSURE SKIN 1/2X4 (GAUZE/BANDAGES/DRESSINGS) ×4 IMPLANT
SUT MNCRL AB 4-0 PS2 18 (SUTURE) ×8 IMPLANT
SUT MON AB 5-0 PS2 18 (SUTURE) IMPLANT
SUT SILK 2 0 SH (SUTURE) ×1 IMPLANT
SUT VIC AB 2-0 SH 27 (SUTURE) ×6
SUT VIC AB 2-0 SH 27XBRD (SUTURE) ×6 IMPLANT
SUT VIC AB 3-0 SH 27 (SUTURE) ×6
SUT VIC AB 3-0 SH 27X BRD (SUTURE) ×6 IMPLANT
SYR CONTROL 10ML LL (SYRINGE) ×4 IMPLANT
TOWEL GREEN STERILE (TOWEL DISPOSABLE) ×4 IMPLANT
TOWEL GREEN STERILE FF (TOWEL DISPOSABLE) ×4 IMPLANT
TRACER MAGTRACE VIAL (MISCELLANEOUS) ×1 IMPLANT

## 2021-08-26 NOTE — Anesthesia Postprocedure Evaluation (Signed)
Anesthesia Post Note  Patient: Amanda Smith  Procedure(s) Performed: LEFT BREAST LUMPECTOMY WITH, INJECTION BLUE DYE AND MAGTRACE (Left: Breast) LEFT AXILLARY SENTINEL NODE BIOPSY (Left: Axilla)     Patient location during evaluation: PACU Anesthesia Type: General Level of consciousness: awake and alert and oriented Pain management: pain level controlled Vital Signs Assessment: post-procedure vital signs reviewed and stable Respiratory status: spontaneous breathing, nonlabored ventilation and respiratory function stable Cardiovascular status: blood pressure returned to baseline and stable Postop Assessment: no apparent nausea or vomiting Anesthetic complications: no   No notable events documented.  Last Vitals:  Vitals:   08/26/21 1345 08/26/21 1400  BP: 131/81 129/67  Pulse: 73 65  Resp: 14 15  Temp:    SpO2: 100% 100%    Last Pain:  Vitals:   08/26/21 1400  TempSrc:   PainSc: 5                  Bunyan Brier A.

## 2021-08-26 NOTE — Anesthesia Procedure Notes (Signed)
Anesthesia Regional Block: Pectoralis block   Pre-Anesthetic Checklist: , timeout performed,  Correct Patient, Correct Site, Correct Laterality,  Correct Procedure, Correct Position, site marked,  Risks and benefits discussed,  Surgical consent,  Pre-op evaluation,  At surgeon's request and post-op pain management  Laterality: Left  Prep: chloraprep       Needles:  Injection technique: Single-shot  Needle Type: Echogenic Stimulator Needle     Needle Length: 10cm  Needle Gauge: 21   Needle insertion depth: 6 cm   Additional Needles:   Procedures:,,,, ultrasound used (permanent image in chart),,    Narrative:  Start time: 08/26/2021 11:17 AM End time: 08/26/2021 11:22 AM Injection made incrementally with aspirations every 5 mL.  Performed by: Personally  Anesthesiologist: Josephine Igo, MD  Additional Notes: Timeout performed. Patient sedated. Relevant anatomy ID'd using [place]. Incremental 2-49m injection of LA with frequent aspiration. Patient tolerated procedure well.     Left Pectoralis Block

## 2021-08-26 NOTE — Anesthesia Preprocedure Evaluation (Signed)
Anesthesia Evaluation  Patient identified by MRN, date of birth, ID band Patient awake    Reviewed: Allergy & Precautions, NPO status , Patient's Chart, lab work & pertinent test results  Airway Mallampati: II  TM Distance: >3 FB Neck ROM: Full    Dental no notable dental hx. 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 negative psych ROS   GI/Hepatic negative GI ROS, Neg liver ROS,   Endo/Other  Left Breast Ca S/P ChemoRx  Renal/GU negative Renal ROS  negative genitourinary   Musculoskeletal negative musculoskeletal ROS (+)   Abdominal   Peds  Hematology  (+) Blood dyscrasia, anemia ,   Anesthesia Other Findings   Reproductive/Obstetrics                             Anesthesia Physical Anesthesia Plan  ASA: 2  Anesthesia Plan: General   Post-op Pain Management: Regional block* and Minimal or no pain anticipated   Induction: Intravenous  PONV Risk Score and Plan: 4 or greater and Treatment may vary due to age or medical condition, Midazolam, Ondansetron and Dexamethasone  Airway Management Planned: LMA  Additional Equipment: None  Intra-op Plan:   Post-operative Plan: Extubation in OR  Informed Consent: I have reviewed the patients History and Physical, chart, labs and discussed the procedure including the risks, benefits and alternatives for the proposed anesthesia with the patient or authorized representative who has indicated his/her understanding and acceptance.     Dental advisory given  Plan Discussed with: CRNA and Anesthesiologist  Anesthesia Plan Comments:         Anesthesia Quick Evaluation

## 2021-08-26 NOTE — Transfer of Care (Signed)
Immediate Anesthesia Transfer of Care Note  Patient: Amanda Smith  Procedure(s) Performed: LEFT BREAST LUMPECTOMY WITH, INJECTION BLUE DYE AND MAGTRACE (Left: Breast) LEFT AXILLARY SENTINEL NODE BIOPSY (Left: Axilla)  Patient Location: PACU  Anesthesia Type:GA combined with regional for post-op pain  Level of Consciousness: drowsy, patient cooperative and responds to stimulation  Airway & Oxygen Therapy: Patient Spontanous Breathing  Post-op Assessment: Report given to RN and Post -op Vital signs reviewed and stable  Post vital signs: Reviewed and stable  Last Vitals:  Vitals Value Taken Time  BP 117/69 08/26/21 1256  Temp    Pulse 75 08/26/21 1259  Resp 15 08/26/21 1258  SpO2 98 % 08/26/21 1259  Vitals shown include unvalidated device data.  Last Pain:  Vitals:   08/26/21 1125  TempSrc:   PainSc: 0-No pain         Complications: No notable events documented.

## 2021-08-26 NOTE — Anesthesia Procedure Notes (Signed)
Procedure Name: LMA Insertion Date/Time: 08/26/2021 11:45 AM  Performed by: Janace Litten, CRNAPre-anesthesia Checklist: Patient identified, Emergency Drugs available, Suction available and Patient being monitored Patient Re-evaluated:Patient Re-evaluated prior to induction Oxygen Delivery Method: Circle System Utilized Preoxygenation: Pre-oxygenation with 100% oxygen Induction Type: IV induction Ventilation: Mask ventilation without difficulty LMA: LMA inserted LMA Size: 4.0 Number of attempts: 1 Placement Confirmation: positive ETCO2 Tube secured with: Tape Dental Injury: Teeth and Oropharynx as per pre-operative assessment

## 2021-08-26 NOTE — H&P (Signed)
56 y.o. female who is seen today for follow up after primary chemo. She initially noted a left breast mass back in December. She has a family history significant for breast cancer Sister died at around age 32. She underwent evaluation with mammography and ultrasound. She has a 3 cm mass in the left upper outer quadrant on mammogram as well as ultrasound. Ultrasound of the left axilla is normal without any adenopathy identified. A core biopsy was then obtained of the mass. 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 MRI showed a 4.5 cm mass in uoq abutting pec. Nodes all normal. There was nme extending from this 3 cm. She has undergone primary chemo which she stopped due to side effects. This was last given May 19 and she has been recovering. Repeat mri shows a good response with mass now measuring 2.1x2.3x2.7 cm. Nodes still normal. No more nme. Her genetic testing is negative. She is here to discuss options  Review of Systems: A complete review of systems was obtained from the patient. I have reviewed this information and discussed as appropriate with the patient. See HPI as well for other ROS.  Review of Systems  All other systems reviewed and are negative.   Medical History: Past Medical History:  Diagnosis Date  Anemia   Patient Active Problem List  Diagnosis  Malignant neoplasm of upper-outer quadrant of left breast in female, estrogen receptor negative (CMS-HCC)   Past Surgical History:  Procedure Laterality Date  CESAREAN SECTION  x2    No Known Allergies  No current outpatient medications on file prior to visit.    Family History  Problem Relation Age of Onset  Stroke Sister    Social History   Tobacco Use  Smoking Status Never  Smokeless Tobacco Never  Marital status: Single  Tobacco Use  Smoking status: Never  Smokeless tobacco: Never  Substance and Sexual Activity  Alcohol use: Not Currently  Drug use: Not  Currently   Objective:   Physical Exam Vitals reviewed.  Constitutional:  Appearance: Normal appearance.  Chest:  Breasts: Right: No inverted nipple, mass or nipple discharge.  Left: Mass present. No inverted nipple or nipple discharge.  Comments: 2.5 cm mobile nontender uoq mass Lymphadenopathy:  Upper Body:  Right upper body: No supraclavicular or axillary adenopathy.  Left upper body: No supraclavicular or axillary adenopathy.  Neurological:  Mental Status: She is alert.    Assessment and Plan:   Malignant neoplasm of upper-outer quadrant of left breast in female, estrogen receptor negative (CMS-HCC)  Left breast lumpectomy, left ax sn biopsy  Need to do this soon given tumor and duration off therapy. She is going to consider options over weekend. Has elected to proceed with lumpectomy not mastectomy. We discussed the staging and pathophysiology of breast cancer. We discussed all of the different options for treatment for breast cancer including surgery, chemotherapy, radiation therapy, Herceptin, and antiestrogen therapy. We discussed a sentinel lymph node biopsy as she does not appear to having lymph node involvement right now. We discussed the performance of that with injection of radioactive tracer. We discussed that there is a chance of having a positive node with a sentinel lymph node biopsy and we will await the permanent pathology to make any other first further decisions in terms of her treatment. We discussed up to a 5% risk lifetime of chronic shoulder pain as well as lymphedema associated with a sentinel lymph node biopsy. We discussed the  options for treatment of the breast cancer which included lumpectomy versus a mastectomy. We discussed the performance of the lumpectomy with radioactive seed placement. We discussed a 5-10% chance of a positive margin requiring reexcision in the operating room. We also discussed that she will likely need radiation therapy if she  undergoes lumpectomy. We discussed mastectomy and the postoperative care for that as well. Mastectomy can be followed by reconstruction. The decision for lumpectomy vs mastectomy has no impact on decision for chemotherapy. Most mastectomy patients will not need radiation therapy. We discussed that there is no difference in her survival whether she undergoes lumpectomy with radiation therapy or antiestrogen therapy versus a mastectomy. There is also no real difference between her recurrence in the breast. We discussed the risks of operation including bleeding, infection, possible reoperation. She understands her further therapy will be based on what her stages at the time of her operation.

## 2021-08-26 NOTE — Op Note (Signed)
Preoperative diagnosis: Stage II left breast cancer s/p primary chemotherapy Postoperative diagnosis: Same as above Procedure: 1.  Left breast lumpectomy  2/  Injection of mag trace and blue dye for sentinel lymph node identification 3.  Left deep axillary sentinel lymph node biopsy Surgeon: Dr. Serita Grammes Anesthesia: General with a pectoral block Estimated blood loss: 50 cc Specimens: 1.  Left breast seed guided lumpectomy containing clip marked with paint 2. Left deep axillary sentinel lymph nodes with highest count of 1348 3. Additional inferior and medial  margins marked short superior, long lateral and double deep Complications: None Drains: None Special count was correct completion Disposition to recovery in stable condition  Indications: 56 y.o. female who is seen today for follow up after primary chemo. She initially noted a left breast mass back in December.  She had a 3 cm mass in the left upper outer quadrant on mammogram as well as ultrasound. Ultrasound of the left axilla is normal without any adenopathy identified.  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 MRI showed a 4.5 cm mass in uoq abutting pec. Nodes all normal. There was nme extending from this 3 cm. She has undergone primary chemo which she stopped due to side effects. Repeat mri shows a good response with mass now measuring 2.1x2.3x2.7 cm. Nodes still normal. No more nme. Her genetic testing is negative. She wanted to proceed with a lumpectomy and sn biopsy.   Procedure: After informed consent was obtained the patient first underwent a pectoral block.  She was given antibiotics.  SCDs were in place.  She was placed under general anesthesia without complication.  She was prepped and draped in the standard sterile surgical fashion.  Surgical timeout was then performed.  I then infiltrated 2 cc of mag trace in the subareolar position.   I also injected 4 cc of blue  dye-saline mixture in the upper outer quadrant.    I identified the tumor in the upper outer quadrant.  I made one incision in the very upper outer breast.  I did remove the skin overlying the tumor as it was very close. I removed the mass and the surrounding tissue with an attempt to get a clear margin. Mammogram confirmed removal of the clip. 3D imaging showed I was close to inferior and medial margins so I removed these and sent off.  The posterior margin is the muscle.  The anterior margin is skin.Marland Kitchen Hemostasis was observed. I closed the breast tissue with 2-0 vicryl. I did place clips in the cavity.  I then entered the axilla by dividing the axillary fascia.  I I used the probe to identify what appeared to be a couple normal-appearing nodes that contained magtrace and were brown stained. One of these was also blue.   There was no other activity once I was completed this.  These were then passed off the table.  Hemostasis was observed. I closed the axillary fascia with 2-0 vicryl.  I then closed skin with 3-0 vicryl and 4-0 monocryl. Glue was placed.   she tolerated well, was extubated and transferred to recovery stable

## 2021-08-26 NOTE — Discharge Instructions (Signed)
[facility] Office Phone Number [phone]  POST OP INSTRUCTIONS Take 400 mg of ibuprofen every 8 hours or 650 mg tylenol every 6 hours for next 72 hours then as needed. Use ice several times daily also.  A prescription for pain medication may be given to you upon discharge.  Take your pain medication as prescribed, if needed.  If narcotic pain medicine is not needed, then you may take acetaminophen (Tylenol), naprosyn (Alleve) or ibuprofen (Advil) as needed. Take your usually prescribed medications unless otherwise directed If you need a refill on your pain medication, please contact your pharmacy.  They will contact our office to request authorization.  Prescriptions will not be filled after 5pm or on week-ends. You should eat very light the first 24 hours after surgery, such as soup, crackers, pudding, etc.  Resume your normal diet the day after surgery. Most patients will experience some swelling and bruising in the breast.  Ice packs and a good support bra will help.  Wear the breast binder provided or a sports bra for 72 hours day and night.  After that wear a sports bra during the day until you return to the office. Swelling and bruising can take several days to resolve.  It is common to experience some constipation if taking pain medication after surgery.  Increasing fluid intake and taking a stool softener will usually help or prevent this problem from occurring.  A mild laxative (Milk of Magnesia or Miralax) should be taken according to package directions if there are no bowel movements after 48 hours. I used skin glue on the incision, you may shower in 24 hours.  The glue will flake off over the next 2-3 weeks.  Any sutures or staples will be removed at the office during your follow-up visit. ACTIVITIES:  You may resume regular daily activities (gradually increasing) beginning the next day.  Wearing a good support bra or sports bra minimizes pain and swelling.  You may have  sexual intercourse when it is comfortable. You may drive when you no longer are taking prescription pain medication, you can comfortably wear a seatbelt, and you can safely maneuver your car and apply brakes. RETURN TO WORK:  ______________________________________________________________________________________ You should see your doctor in the office for a follow-up appointment approximately two weeks after your surgery.  Your doctor's nurse will typically make your follow-up appointment when she calls you with your pathology report.  Expect your pathology report 3-4 business days after your surgery.  You may call to check if you do not hear from us after three days. OTHER INSTRUCTIONS: _______________________________________________________________________________________________ _____________________________________________________________________________________________________________________________________ _____________________________________________________________________________________________________________________________________ _____________________________________________________________________________________________________________________________________  WHEN TO CALL DR Killian Ress: Fever over 101.0 Nausea and/or vomiting. Extreme swelling or bruising. Continued bleeding from incision. Increased pain, redness, or drainage from the incision.  The clinic staff is available to answer your questions during regular business hours.  Please don't hesitate to call and ask to speak to one of the nurses for clinical concerns.  If you have a medical emergency, go to the nearest emergency room or call 911.  A surgeon from [facility] is always on call at the hospital.  For further questions, please visit [email or web address]  

## 2021-08-26 NOTE — Interval H&P Note (Signed)
History and Physical Interval Note:  08/26/2021 11:03 AM  Amanda Smith  has presented today for surgery, with the diagnosis of LEFT BREAST CANCER.  The various methods of treatment have been discussed with the patient and family. After consideration of risks, benefits and other options for treatment, the patient has consented to  Procedure(s) with comments: LEFT BREAST LUMPECTOMY WITH LEFT AXILLARY SENTINEL NODE BX, [place] (Left) - GEN w/ PEC BLOCK as a surgical intervention.  The patient's history has been reviewed, patient examined, no change in status, stable for surgery.  I have reviewed the patient's chart and labs.  Questions were answered to the patient's satisfaction.     Rolm Bookbinder

## 2021-08-27 ENCOUNTER — Encounter (HOSPITAL_COMMUNITY): Payer: Self-pay | Admitting: General Surgery

## 2021-08-28 ENCOUNTER — Encounter: Payer: Self-pay | Admitting: Hematology and Oncology

## 2021-09-01 LAB — SURGICAL PATHOLOGY

## 2021-09-02 ENCOUNTER — Encounter: Payer: Self-pay | Admitting: *Deleted

## 2021-09-09 ENCOUNTER — Telehealth: Payer: Self-pay | Admitting: *Deleted

## 2021-09-09 NOTE — Telephone Encounter (Signed)
This RN received VM from pt stating she is scheduled to see Dr Chryl Heck on the 18th but has heard from Dr Donne Hazel that she will need to have to " go back in and do more surgery"  So do I really need to come in on the 18th?  This note will be forwarded to MD for review for recommendations.

## 2021-09-10 ENCOUNTER — Telehealth: Payer: Self-pay | Admitting: *Deleted

## 2021-09-10 ENCOUNTER — Encounter: Payer: Self-pay | Admitting: *Deleted

## 2021-09-10 NOTE — Telephone Encounter (Signed)
  Message    This RN attempted to contact pt and obtained VM noting mailbox is full and cannot leave a message.  Benay Pike, MD  You 14 hours ago (10:33 PM)   PI I think she can delay it by a week or so but I might recommend keeping it so we can talk about continuing immunotherapy.   Thanks    You routed conversation to Benay Pike, MD 21 hours ago (2:35 PM)   You 21 hours ago (2:34 PM)    This RN received VM from pt stating she is scheduled to see Dr Chryl Heck on the 18th but has heard from Dr Donne Hazel that she will need to have to " go back in and do more surgery"   So do I really need to come in on the 18th?   This note will be forwarded to MD for review for recommendations.

## 2021-09-12 ENCOUNTER — Inpatient Hospital Stay: Payer: Self-pay | Admitting: Hematology and Oncology

## 2021-09-15 ENCOUNTER — Encounter: Payer: Self-pay | Admitting: *Deleted

## 2021-09-17 ENCOUNTER — Encounter: Payer: Self-pay | Admitting: *Deleted

## 2021-09-17 ENCOUNTER — Telehealth: Payer: Self-pay | Admitting: Radiation Oncology

## 2021-09-17 NOTE — Telephone Encounter (Signed)
Called patient to reschedule appointment w. Dr. Isidore Moos. No answer, LVM for a return call.

## 2021-09-18 ENCOUNTER — Telehealth: Payer: Self-pay | Admitting: Radiation Oncology

## 2021-09-18 NOTE — Telephone Encounter (Signed)
Called patient to R/S a follow up appointment w. Dr. Isidore Moos. No answer, LVM for a return call.

## 2021-09-20 ENCOUNTER — Other Ambulatory Visit: Payer: Self-pay

## 2021-09-22 ENCOUNTER — Other Ambulatory Visit: Payer: Self-pay

## 2021-09-22 ENCOUNTER — Encounter (HOSPITAL_BASED_OUTPATIENT_CLINIC_OR_DEPARTMENT_OTHER): Payer: Self-pay | Admitting: General Surgery

## 2021-09-24 ENCOUNTER — Other Ambulatory Visit: Payer: Self-pay | Admitting: General Surgery

## 2021-09-26 ENCOUNTER — Ambulatory Visit: Payer: No Typology Code available for payment source | Admitting: Radiation Oncology

## 2021-09-26 ENCOUNTER — Ambulatory Visit: Payer: No Typology Code available for payment source

## 2021-09-26 ENCOUNTER — Other Ambulatory Visit: Payer: Self-pay

## 2021-09-30 ENCOUNTER — Encounter (HOSPITAL_BASED_OUTPATIENT_CLINIC_OR_DEPARTMENT_OTHER): Payer: Self-pay | Admitting: General Surgery

## 2021-09-30 ENCOUNTER — Ambulatory Visit (HOSPITAL_BASED_OUTPATIENT_CLINIC_OR_DEPARTMENT_OTHER): Payer: Self-pay | Admitting: Certified Registered"

## 2021-09-30 ENCOUNTER — Encounter (HOSPITAL_BASED_OUTPATIENT_CLINIC_OR_DEPARTMENT_OTHER)
Admission: RE | Disposition: A | Discharge status: Home / Self Care | Payer: Self-pay | Source: Home / Self Care | Attending: General Surgery

## 2021-09-30 ENCOUNTER — Ambulatory Visit (HOSPITAL_BASED_OUTPATIENT_CLINIC_OR_DEPARTMENT_OTHER)
Admission: RE | Admit: 2021-09-30 | Discharge: 2021-09-30 | Disposition: A | Discharge status: Home / Self Care | Payer: Self-pay | Attending: General Surgery | Admitting: General Surgery

## 2021-09-30 ENCOUNTER — Other Ambulatory Visit: Payer: Self-pay

## 2021-09-30 DIAGNOSIS — C50912 Malignant neoplasm of unspecified site of left female breast: Secondary | ICD-10-CM

## 2021-09-30 DIAGNOSIS — Z01818 Encounter for other preprocedural examination: Secondary | ICD-10-CM

## 2021-09-30 DIAGNOSIS — C50412 Malignant neoplasm of upper-outer quadrant of left female breast: Secondary | ICD-10-CM | POA: Insufficient documentation

## 2021-09-30 DIAGNOSIS — Z9221 Personal history of antineoplastic chemotherapy: Secondary | ICD-10-CM | POA: Insufficient documentation

## 2021-09-30 DIAGNOSIS — Z452 Encounter for adjustment and management of vascular access device: Secondary | ICD-10-CM | POA: Insufficient documentation

## 2021-09-30 DIAGNOSIS — Z171 Estrogen receptor negative status [ER-]: Secondary | ICD-10-CM | POA: Insufficient documentation

## 2021-09-30 DIAGNOSIS — C773 Secondary and unspecified malignant neoplasm of axilla and upper limb lymph nodes: Secondary | ICD-10-CM | POA: Insufficient documentation

## 2021-09-30 HISTORY — PX: RE-EXCISION OF BREAST LUMPECTOMY: SHX6048

## 2021-09-30 HISTORY — PX: PORT-A-CATH REMOVAL: SHX5289

## 2021-09-30 SURGERY — EXCISION, LESION, BREAST
Anesthesia: General | Site: Chest | Laterality: Right

## 2021-09-30 MED ORDER — ONDANSETRON HCL 4 MG/2ML IJ SOLN
INTRAMUSCULAR | Status: DC | PRN
  Administered 2021-09-30: 4 mg via INTRAVENOUS

## 2021-09-30 MED ORDER — ACETAMINOPHEN 500 MG PO TABS
1000.0000 mg | ORAL_TABLET | Freq: Once | ORAL | Status: AC
Start: 2021-09-30 — End: 2021-09-30
  Administered 2021-09-30: 1000 mg via ORAL

## 2021-09-30 MED ORDER — OXYCODONE HCL 5 MG PO TABS: 5.0000 mg | ORAL_TABLET | Freq: Once | ORAL | Status: DC | PRN

## 2021-09-30 MED ORDER — LIDOCAINE HCL (CARDIAC) PF 100 MG/5ML IV SOSY
PREFILLED_SYRINGE | INTRAVENOUS | Status: DC | PRN
  Administered 2021-09-30: 60 mg via INTRAVENOUS

## 2021-09-30 MED ORDER — ENSURE PRE-SURGERY PO LIQD: 296.0000 mL | Freq: Once | ORAL | Status: DC

## 2021-09-30 MED ORDER — MIDAZOLAM HCL 5 MG/5ML IJ SOLN
INTRAMUSCULAR | Status: DC | PRN
  Administered 2021-09-30: 2 mg via INTRAVENOUS

## 2021-09-30 MED ORDER — AMISULPRIDE (ANTIEMETIC) 5 MG/2ML IV SOLN: 10.0000 mg | Freq: Once | INTRAVENOUS | Status: DC | PRN

## 2021-09-30 MED ORDER — EPHEDRINE SULFATE (PRESSORS) 50 MG/ML IJ SOLN
INTRAMUSCULAR | Status: DC | PRN
  Administered 2021-09-30: 5 mg via INTRAVENOUS

## 2021-09-30 MED ORDER — KETOROLAC TROMETHAMINE 30 MG/ML IJ SOLN
INTRAMUSCULAR | Status: AC
  Filled 2021-09-30: qty 1

## 2021-09-30 MED ORDER — LACTATED RINGERS IV SOLN: INTRAVENOUS | Status: DC

## 2021-09-30 MED ORDER — ONDANSETRON HCL 4 MG/2ML IJ SOLN: 4.0000 mg | Freq: Once | INTRAMUSCULAR | Status: DC | PRN

## 2021-09-30 MED ORDER — PROPOFOL 500 MG/50ML IV EMUL
INTRAVENOUS | Status: DC | PRN
  Administered 2021-09-30: 150 ug/kg/min via INTRAVENOUS

## 2021-09-30 MED ORDER — MIDAZOLAM HCL 2 MG/2ML IJ SOLN
INTRAMUSCULAR | Status: AC
  Filled 2021-09-30: qty 2

## 2021-09-30 MED ORDER — FENTANYL CITRATE (PF) 100 MCG/2ML IJ SOLN
INTRAMUSCULAR | Status: AC
  Filled 2021-09-30: qty 2

## 2021-09-30 MED ORDER — FENTANYL CITRATE (PF) 100 MCG/2ML IJ SOLN
INTRAMUSCULAR | Status: DC | PRN
Start: 2021-09-30 — End: 2021-09-30
  Administered 2021-09-30: 25 ug via INTRAVENOUS

## 2021-09-30 MED ORDER — CHLORHEXIDINE GLUCONATE CLOTH 2 % EX PADS: 6.0000 | MEDICATED_PAD | Freq: Once | CUTANEOUS | Status: DC

## 2021-09-30 MED ORDER — DEXAMETHASONE SODIUM PHOSPHATE 10 MG/ML IJ SOLN
INTRAMUSCULAR | Status: DC | PRN
  Administered 2021-09-30: 10 mg via INTRAVENOUS

## 2021-09-30 MED ORDER — LIDOCAINE 2% (20 MG/ML) 5 ML SYRINGE
INTRAMUSCULAR | Status: AC
  Filled 2021-09-30: qty 5

## 2021-09-30 MED ORDER — FENTANYL CITRATE (PF) 100 MCG/2ML IJ SOLN
25.0000 ug | INTRAMUSCULAR | Status: DC | PRN
  Administered 2021-09-30 (×3): 25 ug via INTRAVENOUS

## 2021-09-30 MED ORDER — PROPOFOL 10 MG/ML IV BOLUS
INTRAVENOUS | Status: AC
  Filled 2021-09-30: qty 20

## 2021-09-30 MED ORDER — CEFAZOLIN SODIUM-DEXTROSE 2-4 GM/100ML-% IV SOLN
2.0000 g | INTRAVENOUS | Status: AC
  Administered 2021-09-30: 2 g via INTRAVENOUS

## 2021-09-30 MED ORDER — ONDANSETRON HCL 4 MG/2ML IJ SOLN
INTRAMUSCULAR | Status: AC
  Filled 2021-09-30: qty 2

## 2021-09-30 MED ORDER — BUPIVACAINE HCL (PF) 0.25 % IJ SOLN
INTRAMUSCULAR | Status: DC | PRN
  Administered 2021-09-30: 10 mL

## 2021-09-30 MED ORDER — EPHEDRINE 5 MG/ML INJ
INTRAVENOUS | Status: AC
  Filled 2021-09-30: qty 5

## 2021-09-30 MED ORDER — PROPOFOL 10 MG/ML IV BOLUS
INTRAVENOUS | Status: DC | PRN
  Administered 2021-09-30: 140 mg via INTRAVENOUS

## 2021-09-30 MED ORDER — DEXAMETHASONE SODIUM PHOSPHATE 10 MG/ML IJ SOLN
INTRAMUSCULAR | Status: AC
Start: 2021-09-30 — End: ?
  Filled 2021-09-30: qty 1

## 2021-09-30 MED ORDER — KETOROLAC TROMETHAMINE 30 MG/ML IJ SOLN
INTRAMUSCULAR | Status: DC | PRN
  Administered 2021-09-30: 25 mg via INTRAVENOUS

## 2021-09-30 MED ORDER — OXYCODONE HCL 5 MG/5ML PO SOLN: 5.0000 mg | Freq: Once | ORAL | Status: DC | PRN

## 2021-09-30 MED ORDER — ACETAMINOPHEN 500 MG PO TABS
ORAL_TABLET | ORAL | Status: AC
  Filled 2021-09-30: qty 2

## 2021-09-30 MED ORDER — CEFAZOLIN SODIUM-DEXTROSE 2-4 GM/100ML-% IV SOLN
INTRAVENOUS | Status: AC
  Filled 2021-09-30: qty 100

## 2021-09-30 MED ORDER — ACETAMINOPHEN 500 MG PO TABS: 1000.0000 mg | ORAL_TABLET | ORAL | Status: DC

## 2021-09-30 SURGICAL SUPPLY — 43 items
ADH SKN CLS APL DERMABOND .7 (GAUZE/BANDAGES/DRESSINGS) ×2
APL PRP STRL LF DISP 70% ISPRP (MISCELLANEOUS) ×2
BINDER BREAST MEDIUM (GAUZE/BANDAGES/DRESSINGS) IMPLANT
BLADE SURG 15 STRL LF DISP TIS (BLADE) ×3 IMPLANT
BLADE SURG 15 STRL SS (BLADE) ×2
CANISTER SUCT 1200ML W/VALVE (MISCELLANEOUS) ×3 IMPLANT
CHLORAPREP W/TINT 26 (MISCELLANEOUS) ×3 IMPLANT
COVER BACK TABLE 60X90IN (DRAPES) ×3 IMPLANT
COVER MAYO STAND STRL (DRAPES) ×3 IMPLANT
DERMABOND ADVANCED (GAUZE/BANDAGES/DRESSINGS) ×2
DERMABOND ADVANCED .7 DNX12 (GAUZE/BANDAGES/DRESSINGS) ×3 IMPLANT
DRAPE LAPAROSCOPIC ABDOMINAL (DRAPES) ×3 IMPLANT
DRAPE LAPAROTOMY 100X72 PEDS (DRAPES) ×3 IMPLANT
DRAPE UTILITY XL STRL (DRAPES) ×3 IMPLANT
DRSG TEGADERM 4X4.75 (GAUZE/BANDAGES/DRESSINGS) ×3 IMPLANT
ELECT COATED BLADE 2.86 ST (ELECTRODE) ×3 IMPLANT
ELECT REM PT RETURN 9FT ADLT (ELECTROSURGICAL) ×2
ELECTRODE REM PT RTRN 9FT ADLT (ELECTROSURGICAL) ×3 IMPLANT
GAUZE SPONGE 4X4 12PLY STRL LF (GAUZE/BANDAGES/DRESSINGS) ×3 IMPLANT
GLOVE BIO SURGEON STRL SZ7 (GLOVE) ×3 IMPLANT
GLOVE BIOGEL PI IND STRL 7.5 (GLOVE) ×3 IMPLANT
GOWN STRL REUS W/ TWL LRG LVL3 (GOWN DISPOSABLE) ×9 IMPLANT
GOWN STRL REUS W/TWL LRG LVL3 (GOWN DISPOSABLE) ×4
HEMOSTAT ARISTA ABSORB 3G PWDR (HEMOSTASIS) IMPLANT
NDL HYPO 25X1 1.5 SAFETY (NEEDLE) ×3 IMPLANT
NEEDLE HYPO 25X1 1.5 SAFETY (NEEDLE) ×2 IMPLANT
NS IRRIG 1000ML POUR BTL (IV SOLUTION) IMPLANT
PACK BASIN DAY SURGERY FS (CUSTOM PROCEDURE TRAY) ×3 IMPLANT
PENCIL SMOKE EVACUATOR (MISCELLANEOUS) ×3 IMPLANT
SLEEVE SCD COMPRESS KNEE MED (STOCKING) ×3 IMPLANT
SPIKE FLUID TRANSFER (MISCELLANEOUS) ×3 IMPLANT
SPONGE T-LAP 4X18 ~~LOC~~+RFID (SPONGE) ×3 IMPLANT
STRIP CLOSURE SKIN 1/2X4 (GAUZE/BANDAGES/DRESSINGS) ×3 IMPLANT
SUT MNCRL AB 4-0 PS2 18 (SUTURE) ×3 IMPLANT
SUT SILK 2 0 SH (SUTURE) IMPLANT
SUT VIC AB 2-0 SH 27 (SUTURE) ×2
SUT VIC AB 2-0 SH 27XBRD (SUTURE) ×3 IMPLANT
SUT VIC AB 3-0 SH 27 (SUTURE) ×4
SUT VIC AB 3-0 SH 27X BRD (SUTURE) ×3 IMPLANT
SYR CONTROL 10ML LL (SYRINGE) ×3 IMPLANT
TOWEL GREEN STERILE FF (TOWEL DISPOSABLE) ×3 IMPLANT
TUBE CONNECTING 20X1/4 (TUBING) ×3 IMPLANT
YANKAUER SUCT BULB TIP NO VENT (SUCTIONS) ×3 IMPLANT

## 2021-09-30 NOTE — H&P (Signed)
Chief Complaint: left breast cancer HPI: 56 y.o. female who is seen today for follow up after primary chemo. She initially noted a left breast mass back in December. She has a family history significant for breast cancer Sister died at around age 67. She underwent evaluation with mammography and ultrasound. She has a 3 cm mass in the left upper outer quadrant on mammogram as well as ultrasound. Ultrasound of the left axilla is normal without any adenopathy identified. A core biopsy was then obtained of the mass. 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 MRI showed a 4.5 cm mass in uoq abutting pec. Nodes all normal. There was nme extending from this 3 cm. She has undergone primary chemo which she stopped due to side effects. This was last given May 19 and she has been recovering. Repeat mri shows a good response with mass now measuring 2.1x2.3x2.7 cm. Nodes still normal. No more nme. Her genetic testing is negative. She underwent lump/sn.  Her tumor is present at superior margin and had one node positive and five others negative.  Discussed and will proceed with radiotherapy as have six nodes        Past Medical History:  Diagnosis Date   Breast cancer [facility])             Past Surgical History:  Procedure Laterality Date   AXILLARY SENTINEL NODE BIOPSY Left 08/26/2021    Procedure: LEFT AXILLARY SENTINEL NODE BIOPSY;  Surgeon: Rolm Bookbinder, MD;  Location: [place];  Service: General;  Laterality: Left;   BREAST LUMPECTOMY WITH SENTINEL LYMPH NODE BIOPSY Left 08/26/2021    Procedure: LEFT BREAST LUMPECTOMY WITH, INJECTION BLUE DYE AND MAGTRACE;  Surgeon: Rolm Bookbinder, MD;  Location: [place];  Service: General;  Laterality: Left;  GEN w/ [place] Right 03/18/2021    Procedure: INSERTION PORT-A-CATH;  Surgeon: Rolm Bookbinder, MD;  Location: [place];  Service: General;   Laterality: Right;   TUBAL LIGATION       WRIST FRACTURE SURGERY               Family History  Problem Relation Age of Onset   Breast cancer Sister      Social History:  reports that she has never smoked. She has never used smokeless tobacco. She reports that she does not drink alcohol and does not use drugs.   Allergies:       Allergies  Allergen Reactions   Benadryl [Diphenhydramine] Other (See Comments)      Patient received 50 mg of Benadryl PIV for first treatment patient did not tolerate well. Made her very confused and sedated.  Pt states she is not allergic to benadryl.            Medications Prior to Admission  Medication Sig Dispense Refill   Cholecalciferol (VITAMIN D) 50 MCG (2000 UT) CAPS Take 2,000 Units by mouth daily.       traMADol (ULTRAM) 50 MG tablet Take 2 tablets (100 mg total) by mouth every 6 (six) hours as needed. 10 tablet 0   dexamethasone (DECADRON) 4 MG tablet Take 2 tablets (8 mg total) by mouth daily. Start the day after chemotherapy for 2 days. (Patient not taking: Reported on 04/03/2021) 30 tablet 1   gabapentin (NEURONTIN) 300 MG capsule Take 1 capsule (300 mg total) by mouth at bedtime. (Patient not taking: Reported  on 08/14/2021) 30 capsule 0   lidocaine-prilocaine (EMLA) cream Apply to affected area once (Patient not taking: Reported on 08/14/2021) 30 g 3   Multiple Vitamins-Minerals (MULTIVITAMIN WITH MINERALS) tablet Take 1 tablet by mouth daily.       ondansetron (ZOFRAN) 8 MG tablet Take 1 tablet (8 mg total) by mouth 2 (two) times daily as needed for refractory nausea / vomiting. Start on day 3 after chemo. (Patient not taking: Reported on 08/14/2021) 30 tablet 1   prochlorperazine (COMPAZINE) 10 MG tablet Take 1 tablet (10 mg total) by mouth every 6 (six) hours as needed (Nausea or vomiting). (Patient not taking: Reported on 08/14/2021) 30 tablet 1   traMADol (ULTRAM) 50 MG tablet Take 2 tablets (100 mg total) by mouth every 6 (six) hours as  needed. (Patient not taking: Reported on 08/14/2021) 6 tablet 0      Lab Results Last 48 Hours  No results found for this or any previous visit (from the past 48 hour(s)).   Imaging Results (Last 48 hours)  No results found.     Review of Systems  All other systems reviewed and are negative.     Blood pressure 104/68, pulse 76, temperature 97.9 F (36.6 C), temperature source Oral, resp. rate 16, height '5\' 1"'$  (1.549 m), weight 52 kg, SpO2 100 %. Physical Exam   Vitals reviewed.  Constitutional:  Appearance: Normal appearance.  Chest:  Breasts: Right: No inverted nipple, mass or nipple discharge.  Left: healing incision without infection Lymphadenopathy:  Upper Body:  Right upper body: No supraclavicular or axillary adenopathy.  Left upper body: No supraclavicular or axillary adenopathy.  Neurological:  Mental Status: She is alert. Assessment/Plan Left breast re-excision lumpectomy of superior margin Port removal- she understands may continue to be recommended immunotherapy but desires port removal today

## 2021-09-30 NOTE — Interval H&P Note (Deleted)
History and Physical Interval Note:  09/30/2021 8:59 AM  Amanda Smith  has presented today for surgery, with the diagnosis of LEFT BREAST CANCER.  The various methods of treatment have been discussed with the patient and family. After consideration of risks, benefits and other options for treatment, the patient has consented to  Procedure(s): RE-EXCISION OF LEFT BREAST LUMPECTOMY (Left) REMOVAL PORT-A-CATH (N/A) as a surgical intervention.  The patient's history has been reviewed, patient examined, no change in status, stable for surgery.  I have reviewed the patient's chart and labs.  Questions were answered to the patient's satisfaction.     Rolm Bookbinder

## 2021-09-30 NOTE — Anesthesia Postprocedure Evaluation (Signed)
Anesthesia Post Note  Patient: Amanda Smith  Procedure(s) Performed: RE-EXCISION OF LEFT BREAST LUMPECTOMY (Left: Breast) REMOVAL PORT-A-CATH (Right: Chest)     Patient location during evaluation: PACU Anesthesia Type: General Level of consciousness: awake and alert Pain management: pain level controlled Vital Signs Assessment: post-procedure vital signs reviewed and stable Respiratory status: spontaneous breathing, nonlabored ventilation and respiratory function stable Cardiovascular status: blood pressure returned to baseline and stable Postop Assessment: no apparent nausea or vomiting Anesthetic complications: no   No notable events documented.  Last Vitals:  Vitals:   09/30/21 1110 09/30/21 1112  BP:  100/74  Pulse: 73 73  Resp:    Temp:  36.6 C  SpO2:  96%    Last Pain:  Vitals:   09/30/21 1112  TempSrc: Oral  PainSc: 0-No pain                 Lidia Collum

## 2021-09-30 NOTE — Anesthesia Procedure Notes (Signed)
Procedure Name: LMA Insertion Date/Time: 09/30/2021 9:10 AM  Performed by: Lavonia Dana, CRNAPre-anesthesia Checklist: Patient identified, Emergency Drugs available, Suction available and Patient being monitored Patient Re-evaluated:Patient Re-evaluated prior to induction Oxygen Delivery Method: Circle system utilized Preoxygenation: Pre-oxygenation with 100% oxygen Induction Type: IV induction Ventilation: Mask ventilation without difficulty LMA: LMA inserted LMA Size: 4.0 Number of attempts: 1 Airway Equipment and Method: Bite block Placement Confirmation: positive ETCO2 Tube secured with: Tape Dental Injury: Teeth and Oropharynx as per pre-operative assessment

## 2021-09-30 NOTE — Interval H&P Note (Signed)
History and Physical Interval Note:  09/30/2021 9:02 AM  Amanda Smith  has presented today for surgery, with the diagnosis of LEFT BREAST CANCER.  The various methods of treatment have been discussed with the patient and family. After consideration of risks, benefits and other options for treatment, the patient has consented to  Procedure(s): RE-EXCISION OF LEFT BREAST LUMPECTOMY (Left) REMOVAL PORT-A-CATH (N/A) as a surgical intervention.  The patient's history has been reviewed, patient examined, no change in status, stable for surgery.  I have reviewed the patient's chart and labs.  Questions were answered to the patient's satisfaction.     Rolm Bookbinder

## 2021-09-30 NOTE — Op Note (Signed)
Preoperative diagnosis: Stage II left breast cancer status post primary chemotherapy now status post lumpectomy and sentinel node biopsy with positive margin Postoperative diagnosis: Same as above Procedure: 1.  Port removal 2.  Reexcision of left breast superior margin Surgical Dr. Serita Grammes Anesthesia: General Estimated blood loss: Minimal Complications: None Drains: None Specimens: Left breast superior margin marked short superior, long lateral, double deep Disposition recovery stable condition Sponge and count was correct at completion  Indications: This a 56 year old female who underwent primary chemotherapy for a triple negative breast cancer.  She underwent a lumpectomy with a sentinel lymph node biopsy.  This showed a residual tumor of about 3 cm and had a positive superior margin.  We discussed reexcision of this margin.  She had 1 out of 6 lymph nodes that were positive in a multidisciplinary discussion we have elected to proceed with radiotherapy as the other 5 were negative.  She also desires port removal although she understands she may get recommended continued therapy.  Procedure: After informed consent was obtained she was taken to the operating room.  She was given antibiotics.  SCDs were placed.  She was placed under general esthesia without complication.  She was prepped and draped in the standard sterile surgical fashion.  Surgical timeout was then performed.  I have treated Marcaine at the incision overlying her port.  I reentered this incision.  The port and the line as well as the suture material were removed in their entirety.  Hemostasis was obtained.  I then closed this with 3-0 Vicryl, 4-0 Monocryl, glue and Steri-Strips.  I then infiltrated Marcaine in her left upper outer quadrant incision.  I reentered this incision and opened up all the sutures to open up the cavity.  I then identified the superior margin and excised this and marked it as above.  Hemostasis was  then obtained.  I then closed the breast tissue down with 2-0 Vicryl suture.  The skin was closed with 3-0 Vicryl for Monocryl.  Glue and Steri-Strips were applied.  She tolerated this well was extubated transferred to recovery stable.

## 2021-09-30 NOTE — Discharge Instructions (Addendum)
[place] Office Phone Number [phone]  POST OP INSTRUCTIONS Take 400 mg of ibuprofen every 8 hours or 650 mg tylenol every 6 hours for next 72 hours then as needed. Use ice several times daily also.  A prescription for pain medication may be given to you upon discharge.  Take your pain medication as prescribed, if needed.  If narcotic pain medicine is not needed, then you may take acetaminophen (Tylenol), naprosyn (Alleve) or ibuprofen (Advil) as needed. Take your usually prescribed medications unless otherwise directed If you need a refill on your pain medication, please contact your pharmacy.  They will contact our office to request authorization.  Prescriptions will not be filled after 5pm or on week-ends. You should eat very light the first 24 hours after surgery, such as soup, crackers, pudding, etc.  Resume your normal diet the day after surgery. Most patients will experience some swelling and bruising in the breast.  Ice packs and a good support bra will help.  Wear the breast binder provided or a sports bra for 72 hours day and night.  After that wear a sports bra during the day until you return to the office. Swelling and bruising can take several days to resolve.  It is common to experience some constipation if taking pain medication after surgery.  Increasing fluid intake and taking a stool softener will usually help or prevent this problem from occurring.  A mild laxative (Milk of Magnesia or Miralax) should be taken according to package directions if there are no bowel movements after 48 hours. I used skin glue on the incision, you may shower in 24 hours.  The glue will flake off over the next 2-3 weeks.  Any sutures or staples will be removed at the office during your follow-up visit. ACTIVITIES:  You may resume regular daily activities (gradually increasing) beginning the next day.  Wearing a good support bra or sports bra minimizes pain and swelling.  You may have  sexual intercourse when it is comfortable. You may drive when you no longer are taking prescription pain medication, you can comfortably wear a seatbelt, and you can safely maneuver your car and apply brakes. RETURN TO WORK:  ______________________________________________________________________________________ Amanda Smith should see your doctor in the office for a follow-up appointment approximately two weeks after your surgery.  Your doctor's nurse will typically make your follow-up appointment when she calls you with your pathology report.  Expect your pathology report 3-4 business days after your surgery.  You may call to check if you do not hear from [place] after three days. OTHER INSTRUCTIONS: _______________________________________________________________________________________________ _____________________________________________________________________________________________________________________________________ _____________________________________________________________________________________________________________________________________ _____________________________________________________________________________________________________________________________________  WHEN TO CALL DR [place]KEFIELD: Fever over 101.0 Nausea and/or vomiting. Extreme swelling or bruising. Continued bleeding from incision. Increased pain, redness, or drainage from the incision.  The clinic staff is available to answer your questions during regular business hours.  Please don't hesitate to call and ask to speak to one of the nurses for clinical concerns.  If you have a medical emergency, go to the nearest emergency room or call 911.  A surgeon from [facility] is always on call at the hospital.  For further questions, please visit [email or web address]   Post Anesthesia Home Care Instructions  Activity: Get plenty of rest for the remainder of the day. A responsible individual must stay  with you for 24 hours following the procedure.  For the next 24 hours, DO NOT: -Drive a car -Paediatric nurse -Drink alcoholic beverages -Take any medication unless instructed by  your physician -Make any legal decisions or sign important papers.  Meals: Start with liquid foods such as gelatin or soup. Progress to regular foods as tolerated. Avoid greasy, spicy, heavy foods. If nausea and/or vomiting occur, drink only clear liquids until the nausea and/or vomiting subsides. Call your physician if vomiting continues.  Special Instructions/Symptoms: Your throat may feel dry or sore from the anesthesia or the breathing tube placed in your throat during surgery. If this causes discomfort, gargle with warm salt water. The discomfort should disappear within 24 hours.  You may take Tylenol at 130pm today if needed.

## 2021-09-30 NOTE — H&P (Deleted)
Amanda Smith is an 56 y.o. female.   Chief Complaint: left breast cancer HPI: 56 y.o. female who is seen today for follow up after primary chemo. She initially noted a left breast mass back in December. She has a family history significant for breast cancer Sister died at around age 41. She underwent evaluation with mammography and ultrasound. She has a 3 cm mass in the left upper outer quadrant on mammogram as well as ultrasound. Ultrasound of the left axilla is normal without any adenopathy identified. A core biopsy was then obtained of the mass. 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 MRI showed a 4.5 cm mass in uoq abutting pec. Nodes all normal. There was nme extending from this 3 cm. She has undergone primary chemo which she stopped due to side effects. This was last given May 19 and she has been recovering. Repeat mri shows a good response with mass now measuring 2.1x2.3x2.7 cm. Nodes still normal. No more nme. Her genetic testing is negative. She underwent lump/sn.  Her tumor is present at superior margin and nodes are negative.     Past Medical History:  Diagnosis Date   Breast cancer [facility])     Past Surgical History:  Procedure Laterality Date   AXILLARY SENTINEL NODE BIOPSY Left 08/26/2021   Procedure: LEFT AXILLARY SENTINEL NODE BIOPSY;  Surgeon: Rolm Bookbinder, MD;  Location: [place];  Service: General;  Laterality: Left;   BREAST LUMPECTOMY WITH SENTINEL LYMPH NODE BIOPSY Left 08/26/2021   Procedure: LEFT BREAST LUMPECTOMY WITH, INJECTION BLUE DYE AND MAGTRACE;  Surgeon: Rolm Bookbinder, MD;  Location: [place];  Service: General;  Laterality: Left;  GEN w/ [place] Right 03/18/2021   Procedure: INSERTION PORT-A-CATH;  Surgeon: Rolm Bookbinder, MD;  Location: [place];  Service: General;  Laterality: Right;   TUBAL LIGATION     WRIST FRACTURE SURGERY      Family  History  Problem Relation Age of Onset   Breast cancer Sister    Social History:  reports that she has never smoked. She has never used smokeless tobacco. She reports that she does not drink alcohol and does not use drugs.  Allergies:  Allergies  Allergen Reactions   Benadryl [Diphenhydramine] Other (See Comments)    Patient received 50 mg of Benadryl PIV for first treatment patient did not tolerate well. Made her very confused and sedated.  Pt states she is not allergic to benadryl.    Medications Prior to Admission  Medication Sig Dispense Refill   Cholecalciferol (VITAMIN D) 50 MCG (2000 UT) CAPS Take 2,000 Units by mouth daily.     traMADol (ULTRAM) 50 MG tablet Take 2 tablets (100 mg total) by mouth every 6 (six) hours as needed. 10 tablet 0   dexamethasone (DECADRON) 4 MG tablet Take 2 tablets (8 mg total) by mouth daily. Start the day after chemotherapy for 2 days. (Patient not taking: Reported on 04/03/2021) 30 tablet 1   gabapentin (NEURONTIN) 300 MG capsule Take 1 capsule (300 mg total) by mouth at bedtime. (Patient not taking: Reported on 08/14/2021) 30 capsule 0   lidocaine-prilocaine (EMLA) cream Apply to affected area once (Patient not taking: Reported on 08/14/2021) 30 g 3   Multiple Vitamins-Minerals (MULTIVITAMIN WITH MINERALS) tablet Take 1 tablet by mouth daily.     ondansetron (ZOFRAN) 8 MG tablet Take 1 tablet (8 mg total) by  mouth 2 (two) times daily as needed for refractory nausea / vomiting. Start on day 3 after chemo. (Patient not taking: Reported on 08/14/2021) 30 tablet 1   prochlorperazine (COMPAZINE) 10 MG tablet Take 1 tablet (10 mg total) by mouth every 6 (six) hours as needed (Nausea or vomiting). (Patient not taking: Reported on 08/14/2021) 30 tablet 1   traMADol (ULTRAM) 50 MG tablet Take 2 tablets (100 mg total) by mouth every 6 (six) hours as needed. (Patient not taking: Reported on 08/14/2021) 6 tablet 0    No results found for this or any previous visit (from  the past 48 hour(s)). No results found.  Review of Systems  All other systems reviewed and are negative.   Blood pressure 104/68, pulse 76, temperature 97.9 F (36.6 C), temperature source Oral, resp. rate 16, height '5\' 1"'$  (1.549 m), weight 52 kg, SpO2 100 %. Physical Exam   Vitals reviewed.  Constitutional:  Appearance: Normal appearance.  Chest:  Breasts: Right: No inverted nipple, mass or nipple discharge.  Left: healing incision without infection Lymphadenopathy:  Upper Body:  Right upper body: No supraclavicular or axillary adenopathy.  Left upper body: No supraclavicular or axillary adenopathy.  Neurological:  Mental Status: She is alert. Assessment/Plan Left breast re-excision lumpectomy of superior margin Port removal- she understands may continue to be recommended immunotherapy but desires port removal today  Rolm Bookbinder, MD 09/30/2021, 8:57 AM

## 2021-09-30 NOTE — Anesthesia Preprocedure Evaluation (Signed)
Anesthesia Evaluation  Patient identified by MRN, date of birth, ID band Patient awake    Reviewed: Allergy & Precautions, NPO status , Patient's Chart, lab work & pertinent test results  History of Anesthesia Complications Negative for: history of anesthetic complications  Airway Mallampati: II  TM Distance: >3 FB Neck ROM: Full    Dental  (+) Dental Advisory Given, Teeth Intact   Pulmonary neg pulmonary ROS,    Pulmonary exam normal        Cardiovascular negative cardio ROS Normal cardiovascular exam     Neuro/Psych negative neurological ROS     GI/Hepatic negative GI ROS, Neg liver ROS,   Endo/Other  negative endocrine ROS  Renal/GU negative Renal ROS  negative genitourinary   Musculoskeletal negative musculoskeletal ROS (+)   Abdominal   Peds  Hematology negative hematology ROS (+)   Anesthesia Other Findings Left breast cancer s/p lumpectomy  Reproductive/Obstetrics                             Anesthesia Physical Anesthesia Plan  ASA: 2  Anesthesia Plan: General   Post-op Pain Management: Tylenol PO (pre-op)*   Induction: Intravenous  PONV Risk Score and Plan: 3 and Ondansetron, Dexamethasone, Midazolam and Treatment may vary due to age or medical condition  Airway Management Planned: LMA  Additional Equipment: None  Intra-op Plan:   Post-operative Plan: Extubation in OR  Informed Consent: I have reviewed the patients History and Physical, chart, labs and discussed the procedure including the risks, benefits and alternatives for the proposed anesthesia with the patient or authorized representative who has indicated his/her understanding and acceptance.     Dental advisory given  Plan Discussed with:   Anesthesia Plan Comments:         Anesthesia Quick Evaluation

## 2021-09-30 NOTE — Transfer of Care (Signed)
Immediate Anesthesia Transfer of Care Note  Patient: Amanda Smith  Procedure(s) Performed: RE-EXCISION OF LEFT BREAST LUMPECTOMY (Left: Breast) REMOVAL PORT-A-CATH (Right: Chest)  Patient Location: PACU  Anesthesia Type:General  Level of Consciousness: drowsy  Airway & Oxygen Therapy: Patient Spontanous Breathing and Patient connected to face mask oxygen  Post-op Assessment: Report given to RN and Post -op Vital signs reviewed and stable  Post vital signs: Reviewed and stable  Last Vitals:  Vitals Value Taken Time  BP 100/66 09/30/21 0955  Temp    Pulse 77 09/30/21 0957  Resp 17 09/30/21 0957  SpO2 100 % 09/30/21 0957  Vitals shown include unvalidated device data.  Last Pain:  Vitals:   09/30/21 0743  TempSrc: Oral  PainSc: 0-No pain      Patients Stated Pain Goal: 5 (57/89/78 4784)  Complications: No notable events documented.

## 2021-10-01 ENCOUNTER — Encounter (HOSPITAL_BASED_OUTPATIENT_CLINIC_OR_DEPARTMENT_OTHER): Payer: Self-pay | Admitting: General Surgery

## 2021-10-01 ENCOUNTER — Encounter: Payer: Self-pay | Admitting: *Deleted

## 2021-10-01 LAB — SURGICAL PATHOLOGY

## 2021-10-09 ENCOUNTER — Encounter: Payer: Self-pay | Admitting: Hematology and Oncology

## 2021-10-09 NOTE — Telephone Encounter (Signed)
No entry 

## 2021-10-30 ENCOUNTER — Telehealth: Payer: Self-pay | Admitting: Radiation Oncology

## 2021-10-30 NOTE — Telephone Encounter (Signed)
Pt called to cx 10/6 consult at this time. Pt states she is unsure if she wants to proceed with XRT. Pt was offered FUN with no SIM to discuss any concerns with Dr. Isidore Moos, pt still unsure. Pt asked for [place] to keep her referral open at this time, she will f/u by 10/13 to determine if she wants to proceed or close referral.

## 2021-10-31 ENCOUNTER — Ambulatory Visit: Payer: Self-pay | Admitting: Radiation Oncology

## 2021-10-31 ENCOUNTER — Ambulatory Visit: Payer: Self-pay

## 2021-11-06 ENCOUNTER — Telehealth: Payer: Self-pay | Admitting: Radiation Oncology

## 2021-11-06 NOTE — Telephone Encounter (Signed)
LVM to f/u with pt to see if still interested in FUN with Dr. Isidore Moos

## 2021-11-07 ENCOUNTER — Telehealth: Payer: Self-pay | Admitting: Radiation Oncology

## 2021-11-07 ENCOUNTER — Telehealth: Payer: Self-pay

## 2021-11-07 NOTE — Telephone Encounter (Signed)
Patient left VM requesting return call about upcoming appointment.   Returned patient's call and spoke with patient directly. She stated she has decided she does not want to receive radiation. Encouraged patient to speak with Dr. Isidore Moos one more time to discuss how her prognosis would be affected should she not receive radiation, but she declined. Patient asked what her next steps would be since she wasn't receiving any systemic treatment and now wouldn't be getting radiation. Informed her I would reach out to her oncology team for guidance, and then someone from [facility] would call her back with an update. Patient verbalized understanding and agreement and denied any other needs at this time

## 2021-11-07 NOTE — Telephone Encounter (Signed)
LVM to f/u with pt to see if still interested in FUN with Dr. Isidore Moos

## 2021-11-10 ENCOUNTER — Telehealth: Payer: Self-pay | Admitting: *Deleted

## 2021-11-10 ENCOUNTER — Encounter: Payer: Self-pay | Admitting: Radiation Oncology

## 2021-11-10 ENCOUNTER — Telehealth: Payer: Self-pay | Admitting: Radiation Oncology

## 2021-11-10 ENCOUNTER — Encounter: Payer: Self-pay | Admitting: *Deleted

## 2021-11-10 NOTE — Telephone Encounter (Signed)
Attempted to call patient to let her know that we have sent a scheduling message for her to follow up with Dr.Iruku since she is refusing radiation. Her VM was full and I was unable to leave a message.

## 2021-11-10 NOTE — Progress Notes (Signed)
I received word from nursing that Ms. Alessio declined the opportunity to schedule a follow-up with me as well as to schedule radiation planning for her breast cancer.  I have reviewed her chart and given her breast conserving surgery and extent of disease it is in her best interest, in my opinion, that she receive radiation therapy.  I reached out to the patient by phone today and had a lengthy discussion with her about the pros and cons of radiation therapy.  I let her know that in my medical opinion she should undergo radiation therapy to minimize the risk of relapse of her cancer in the breast and lymph nodes.  I also explained that radiation therapy was part of her treatment plan when Dr. Donne Hazel performed breast conserving surgery rather than mastectomy.  I listened to her thoughts regarding adjuvant therapy and the reasons that she does not want to pursue adjuvant therapy.  After a lengthy conversation it was clear that the patient is not interested in coming in to see me in person for follow-up nor to undergo radiation planning.  She is interested in a surveillance plan.  I let our navigators note.  Patient also understands that she should call our cancer center if she changes her mind about adjuvant therapy.  I wished her the very best. -----------------------------------  Eppie Gibson, MD

## 2021-11-10 NOTE — Telephone Encounter (Signed)
Unable to LVM for pt.

## 2021-11-11 ENCOUNTER — Telehealth: Payer: Self-pay | Admitting: Radiation Oncology

## 2021-11-11 NOTE — Telephone Encounter (Signed)
Unable to LVM to f/u with pt.

## 2021-11-13 ENCOUNTER — Encounter: Payer: Self-pay | Admitting: *Deleted

## 2021-11-13 ENCOUNTER — Telehealth: Payer: Self-pay | Admitting: Radiation Oncology

## 2021-11-13 NOTE — Telephone Encounter (Signed)
10/19 @ 8:52 am spoke to patient she decline treatments at this time.  She is aware to called RadOnc or her referral provider if change her mind in the future.  At this point I am closing out referral.

## 2021-11-17 ENCOUNTER — Telehealth: Payer: Self-pay | Admitting: Radiation Oncology

## 2021-11-17 NOTE — Telephone Encounter (Signed)
Unable to LVM. Letter sent 11/17/21.

## 2021-11-20 ENCOUNTER — Encounter: Payer: Self-pay | Admitting: *Deleted

## 2021-11-20 ENCOUNTER — Telehealth: Payer: Self-pay | Admitting: *Deleted

## 2021-11-20 NOTE — Telephone Encounter (Signed)
Left message for patient to return call to schedule follow up with Dr. Chryl Heck. She has been contacted multiple times with no response or unable to leave VM due to it being full.

## 2021-11-21 ENCOUNTER — Encounter: Payer: Self-pay | Admitting: *Deleted

## 2021-11-25 ENCOUNTER — Other Ambulatory Visit: Payer: Self-pay

## 2021-11-27 ENCOUNTER — Other Ambulatory Visit: Payer: Self-pay

## 2021-11-28 ENCOUNTER — Inpatient Hospital Stay: Payer: Self-pay | Admitting: Hematology and Oncology

## 2021-12-12 ENCOUNTER — Inpatient Hospital Stay: Payer: Self-pay | Attending: Hematology and Oncology | Admitting: Hematology and Oncology

## 2021-12-12 ENCOUNTER — Encounter: Payer: Self-pay | Admitting: Hematology and Oncology

## 2021-12-12 ENCOUNTER — Other Ambulatory Visit: Payer: Self-pay

## 2021-12-12 VITALS — BP 115/68 | HR 81 | Temp 97.7°F | Resp 16 | Ht 61.0 in | Wt 120.3 lb

## 2021-12-12 DIAGNOSIS — Z171 Estrogen receptor negative status [ER-]: Secondary | ICD-10-CM | POA: Insufficient documentation

## 2021-12-12 DIAGNOSIS — C50412 Malignant neoplasm of upper-outer quadrant of left female breast: Secondary | ICD-10-CM | POA: Insufficient documentation

## 2021-12-12 NOTE — Progress Notes (Signed)
[facility]:    Pa[address] [employer], [address] [place] [address]   DIAGNOSIS:  Cancer Staging  Malignant neoplasm of upper-outer quadrant of left breast in female, estrogen receptor negative (Amanda Smith) Staging form: Breast, AJCC 8th Edition - Clinical stage from 03/07/2021: Stage IIB (cT2, cN0, cM0, G3, ER-, PR-, HER2-) - Signed by Amanda Lose, MD on 03/07/2021 Stage prefix: Initial diagnosis Histologic grading system: 3 grade system   SUMMARY OF ONCOLOGIC HISTORY: Oncology History  Malignant neoplasm of upper-outer quadrant of left breast in female, estrogen receptor negative ([place])  02/12/2021 Initial Diagnosis   palpable solid mass in the left upper outer quadrant. Diagnostic mammogram on 02/12/2021 showed a 3 cm mass in the left upper outer quadrant. Biopsy on 02/20/2021 showed grade 3 invasive ductal carcinoma, ER/PR/Her2-([facility])   03/07/2021 Cancer Staging   Staging form: Breast, AJCC 8th Edition - Clinical stage from 03/07/2021: Stage IIB (cT2, cN0, cM0, G3, ER-, PR-, HER2-) - Signed by Amanda Lose, MD on 03/07/2021 Stage prefix: Initial diagnosis Histologic grading system: 3 grade system   03/28/2021 -  Chemotherapy   Patient is on Treatment Plan : BREAST Pembrolizumab (200) D1 + Carboplatin (1.5) D1,8,15 + Paclitaxel (80) D1,8,15 q21d X 4 cycles / Pembrolizumab (200) D1 + AC D1 q21d x 4 cycles      CURRENT THERAPY: Taxol, Carbo, Pembrolizumab  INTERVAL HISTORY:  She didn't want to complete neoadj chemotherapy because of intense itching that almost made her cry along with some burning pain. So she went on to proceed with surgery, final pathology showed 33 mm IDC with medullary feature, one LN with metastatic carcinoma. She had re excision of margin which was finally negative. She says she feels well today. She feels like she has given good amount of thought to it, she doesn't feel like she needs any additional  chemotherapy or radiation therapy. Rest of the pertinent 10 point ROS reviewed and negative.  Patient Active Problem List   Diagnosis Date Noted   Neuropathic pruritus 05/30/2021   Port-A-Cath in place 05/09/2021   Malignant neoplasm of upper-outer quadrant of left breast in female, estrogen receptor negative ([place]) 03/06/2021    is allergic to benadryl [diphenhydramine].  MEDICAL HISTORY: Past Medical History:  Diagnosis Date   Breast cancer [facility])     SURGICAL HISTORY: Past Surgical History:  Procedure Laterality Date   AXILLARY SENTINEL NODE BIOPSY Left 08/26/2021   Procedure: LEFT AXILLARY SENTINEL NODE BIOPSY;  Surgeon: Rolm Bookbinder, MD;  Location: [place];  Service: General;  Laterality: Left;   BREAST LUMPECTOMY WITH SENTINEL LYMPH NODE BIOPSY Left 08/26/2021   Procedure: LEFT BREAST LUMPECTOMY WITH, INJECTION BLUE DYE AND MAGTRACE;  Surgeon: Rolm Bookbinder, MD;  Location: [place];  Service: General;  Laterality: Left;  GEN w/ [place] Right 09/30/2021   Procedure: REMOVAL PORT-A-CATH;  Surgeon: Rolm Bookbinder, MD;  Location: [facility];  Service: General;  Laterality: Right;   PORTACATH PLACEMENT Right 03/18/2021   Procedure: INSERTION PORT-A-CATH;  Surgeon: Rolm Bookbinder, MD;  Location: [facility];  Service: General;  Laterality: Right;   RE-EXCISION OF BREAST LUMPECTOMY Left 09/30/2021   Procedure: RE-EXCISION OF LEFT BREAST LUMPECTOMY;  Surgeon: Rolm Bookbinder, MD;  Location: [facility];  Service: General;  Laterality: Left;   TUBAL LIGATION     WRIST FRACTURE SURGERY      SOCIAL HISTORY:  Social History   Socioeconomic History   Marital status: Married    Spouse name: Not on file   Number of children: Not on file   Years of education: Not on file   Highest education level: Not on file  Occupational History   Not on file  Tobacco Use   Smoking status: Never    Smokeless tobacco: Never  Vaping Use   Vaping Use: Never used  Substance and Sexual Activity   Alcohol use: Never   Drug use: Never   Sexual activity: Yes    Birth control/protection: Post-menopausal  Other Topics Concern   Not on file  Social History Narrative   Not on file   Social Determinants of Health   Financial Resource Strain: Not on file  Food Insecurity: Not on file  Transportation Needs: Not on file  Physical Activity: Not on file  Stress: Not on file  Social Connections: Not on file  Intimate Partner Violence: Not on file    FAMILY HISTORY: Family History  Problem Relation Age of Onset   Breast cancer Sister     Review of Systems  Constitutional:  Negative for appetite change, chills, fatigue, fever and unexpected weight change.  HENT:   Negative for hearing loss, lump/mass and trouble swallowing.   Eyes:  Negative for eye problems and icterus.  Respiratory:  Negative for chest tightness, cough and shortness of breath.   Cardiovascular:  Negative for chest pain, leg swelling and palpitations.  Gastrointestinal:  Negative for abdominal distention, abdominal pain, constipation, diarrhea, nausea and vomiting.  Endocrine: Negative for hot flashes.  Genitourinary:  Negative for difficulty urinating.   Musculoskeletal:  Negative for arthralgias.  Skin:  Negative for itching and rash.  Neurological:  Negative for dizziness, extremity weakness, headaches and numbness.  Hematological:  Negative for adenopathy. Does not bruise/bleed easily.  Psychiatric/Behavioral:  Negative for depression. The patient is not nervous/anxious.       PHYSICAL EXAMINATION  ECOG PERFORMANCE STATUS: 1 - Symptomatic but completely ambulatory  Vitals:   12/12/21 1000  BP: 115/68  Pulse: 81  Resp: 16  Temp: 97.7 F (36.5 C)  SpO2: 100%    Physical Exam Constitutional:      Appearance: Normal appearance.  Eyes:     General: No scleral icterus. Chest:     Comments: Left  breast post surgical changes. No palpable masses or regional adenopathy Musculoskeletal:     Cervical back: Normal range of motion. No rigidity.  Lymphadenopathy:     Cervical: No cervical adenopathy.  Skin:    General: Skin is warm and dry.  Neurological:     Mental Status: She is alert.     LABORATORY DATA:  CBC    Component Value Date/Time   WBC 3.6 (L) 08/22/2021 0900   RBC 3.91 08/22/2021 0900   HGB 11.3 (L) 08/22/2021 0900   HGB 9.7 (L) 06/13/2021 0825   HCT 35.5 (L) 08/22/2021 0900   PLT 170 08/22/2021 0900   PLT 227 06/13/2021 0825   MCV 90.8 08/22/2021 0900   MCH 28.9 08/22/2021 0900   MCHC 31.8 08/22/2021 0900   RDW 13.7 08/22/2021 0900   LYMPHSABS 1.1 06/13/2021 0825   MONOABS 0.3 06/13/2021 0825   EOSABS 0.1 06/13/2021 0825   BASOSABS 0.0 06/13/2021 0825    CMP     Component Value Date/Time   NA 139 08/22/2021 0900   K 4.4 08/22/2021 0900   CL 104 08/22/2021 0900   CO2 29 08/22/2021 0900  GLUCOSE 100 (H) 08/22/2021 0900   BUN 14 08/22/2021 0900   CREATININE 0.99 08/22/2021 0900   CREATININE 0.87 06/13/2021 0825   CALCIUM 9.4 08/22/2021 0900   PROT 7.7 06/13/2021 0825   ALBUMIN 3.9 06/13/2021 0825   AST 28 06/13/2021 0825   ALT 23 06/13/2021 0825   ALKPHOS 83 06/13/2021 0825   BILITOT 0.8 06/13/2021 0825   GFRNONAA >60 08/22/2021 0900   GFRNONAA >60 06/13/2021 0825     CASE: [id number] PATIENT: Amanda Smith Surgical Pathology Report   Reason for Addendum #1:  Breast Biomarker Results Reason for Addendum #2:  Molecular Genetic Test Results, FISH  Clinical History: left breast cancer (cm)     FINAL MICROSCOPIC DIAGNOSIS:  A.   BREAST, LEFT, LUMPECTOMY: -    Invasive carcinoma with medullary features, grade 3, 33 mm in greatest dimension. -    Margin status:  Invasive carcinoma present at margin (superior), 0.6 mm focus, and at inferior resection edge (not true margin-additional inferior margin tissue submitted as Specimen  H).,  B.   LYMPH NODE, LEFT AXILLARY, SENTINEL, EXCISION: -    Lymph node with metastatic carcinoma, 4 mm focus (1/1).  C.   LYMPH NODE, LEFT AXILLARY, SENTINEL, EXCISION: -    Lymph node without identified malignancy (0/1).  D.   LYMPH NODE, LEFT AXILLARY, SENTINEL, EXCISION: -    Lymph node without identified malignancy (0/1).  E.   LYMPH NODE, LEFT AXILLARY, SENTINEL, EXCISION: -    Lymph node without identified malignancy (0/1).  F.   LYMPH NODE, LEFT AXILLARY, SENTINEL, EXCISION: -    Lymph node without identified malignancy (0/1).  G.   LYMPH NODE, LEFT AXILLARY, SENTINEL, EXCISION: -    Lymph node without identified malignancy (0/1).  H.   BREAST, LEFT ADDITIONAL INFERIOR MARGIN, EXCISION: -    Breast tissue with no malignancy identified.  I.   BREAST, LEFT ADDITIONAL MEDIAL MARGIN, EXCISION: -    Breast tissue with no malignancy identified.   ONCOLOGY TABLE:   INVASIVE CARCINOMA OF THE BREAST:  Resection  Procedure: Excision (less than total mastectomy) Specimen Laterality: Left Histologic Type: Invasive carcinoma with medullary features. Histologic Grade:      Glandular (Acinar)/Tubular Differentiation: Score 3      Nuclear Pleomorphism: Score 3      Mitotic Rate: Score 3      Overall Grade: Grade 3 Tumor Size:  33 mm in greatest dimension Ductal Carcinoma In Situ: Not identified. Tumor Extent: Not applicable (skin, nipple and skeletal muscle are absent (nipple and skeletal muscle) or uninvolved (skin)) Treatment Effect in the Breast: No known presurgical therapy Margins: Invasive carcinoma present at margin      Specify Margin involved by invasive carcinoma: Superior margin, 0.6 mm focus. DCIS Margins: Not applicable Regional Lymph Nodes: Regional lymph nodes present. Tumor present in regional lymph node.      Number of Lymph Nodes Examined: 6      Number of Sentinel Nodes Examined: 6      Number of Lymph Nodes with Macrometastases (>2 mm): 1      Number  of Lymph Nodes with Micrometastases: 0      Number of Lymph Nodes with Isolated Tumor Cells (=0.2 mm or =200 cells): 0      Size of Largest Metastatic Deposit (mm): Not applicable      Extranodal Extension: Not applicable Distant Metastasis:      Distant Site(s) Involved: Not applicable Pathologic Stage Classification (pTNM, AJCC 8th Edition): pT2,  pN1a Representative Tumor Block: A1 Comment(s):  Foci of suspected squamous differentiation are p40 non-reactive (squamous marker).   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  01/2021: Left upper outer quadrant biopsy shows grade 3, 3 cm invasive ductal carcinoma triple negative.  Treatment Plan:  1. Neoadjuvant chemotherapy with pembrolizumab, carboplatin, Taxol followed by pembrolizumab, Adriamycin, Cytoxan. 2. Followed by breast conserving surgery with sentinel lymph node study 3. Followed by adjuvant radiation therapy No role for antiestrogen therapy since this is a triple negative breast cancer 4. Adjuvant immunotherapy for 9 cycles.  She completed C4D8 and cancelled her last treatment of carbotaxol. She went on to surgery because chemo side effects were intolerable. Final pathology showed IDC with medullary features, one LN with macromets, prognostics are triple neg. We discussed standard of care is adj radiation and adj immunotherapy We discussed that the risk of recurrence is higher without adjuvant treatment, and occasionally breast cancer can return in distant organs and this unfortunately becomes metastatic breast cancer and this is incurable. She  is fully aware of the consequences but would like to be just observed for now She has strong faith and she is in peace with this decision.  She was strongly encouraged to contact us with any questions or concerns. RTC in 6 months or sooner as needed. Total encounter time: 30 minutes in face-to-face visit time, chart review, lab review, care coordination, order entry, and documentation of the encounter.  *Total Encounter Time as defined by the Centers for Medicare and Medicaid Services includes, in addition to the face-to-face time of a patient visit (documented in the note above) non-face-to-face time: obtaining and reviewing outside history, ordering and reviewing medications, tests or procedures, care coordination (communications with other health care professionals or caregivers) and documentation in the medical record.

## 2021-12-12 NOTE — Assessment & Plan Note (Signed)
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  01/2021: Left upper outer quadrant biopsy shows grade 3, 3 cm invasive ductal carcinoma triple negative.  Treatment Plan:  1. Neoadjuvant chemotherapy with pembrolizumab, carboplatin, Taxol followed by pembrolizumab, Adriamycin, Cytoxan. 2. Followed by breast conserving surgery with sentinel lymph node study 3. Followed by adjuvant radiation therapy No role for antiestrogen therapy since this is a triple negative breast cancer 4. Adjuvant immunotherapy for 9 cycles.  She completed C4D8 and cancelled her last treatment of carbotaxol. She went on to surgery because chemo side effects were intolerable. Final pathology showed IDC with medullary features, one LN with macromets, prognostics are triple neg. We discussed standard of care is adj radiation and adj immunotherapy We discussed that the risk of recurrence is higher without adjuvant treatment, and occasionally breast cancer can return in distant organs and this unfortunately becomes metastatic breast cancer and this is incurable. She is fully aware of the consequences but would like to be just observed for now She has strong faith and she is in peace with this decision.  She was strongly encouraged to contact us with any questions or concerns. RTC in 6 months or sooner as needed.

## 2021-12-13 ENCOUNTER — Other Ambulatory Visit: Payer: Self-pay

## 2022-03-23 ENCOUNTER — Telehealth: Payer: Self-pay | Admitting: Hematology and Oncology

## 2022-03-23 NOTE — Telephone Encounter (Signed)
Reached out to reschedule patient, no answer.

## 2022-03-25 ENCOUNTER — Other Ambulatory Visit: Payer: Self-pay

## 2022-06-12 ENCOUNTER — Ambulatory Visit: Payer: Self-pay | Admitting: Hematology and Oncology

## 2022-06-16 ENCOUNTER — Inpatient Hospital Stay: Payer: Self-pay | Attending: Hematology and Oncology | Admitting: Hematology and Oncology

## 2022-06-17 ENCOUNTER — Telehealth: Payer: Self-pay | Admitting: Hematology and Oncology

## 2022-06-17 NOTE — Telephone Encounter (Signed)
Spoke with patient confirming upcoming appointment  

## 2022-06-19 ENCOUNTER — Other Ambulatory Visit: Payer: Self-pay

## 2022-07-15 ENCOUNTER — Inpatient Hospital Stay: Payer: Self-pay | Attending: Hematology and Oncology | Admitting: Hematology and Oncology

## 2022-07-15 ENCOUNTER — Other Ambulatory Visit: Payer: Self-pay

## 2022-07-15 ENCOUNTER — Inpatient Hospital Stay: Payer: Self-pay

## 2022-07-15 VITALS — BP 117/72 | HR 90 | Temp 97.8°F | Resp 14 | Ht 61.0 in | Wt 130.4 lb

## 2022-07-15 DIAGNOSIS — Z171 Estrogen receptor negative status [ER-]: Secondary | ICD-10-CM | POA: Insufficient documentation

## 2022-07-15 DIAGNOSIS — C50412 Malignant neoplasm of upper-outer quadrant of left female breast: Secondary | ICD-10-CM | POA: Insufficient documentation

## 2022-07-15 DIAGNOSIS — L298 Other pruritus: Secondary | ICD-10-CM | POA: Insufficient documentation

## 2022-07-15 DIAGNOSIS — Z923 Personal history of irradiation: Secondary | ICD-10-CM | POA: Insufficient documentation

## 2022-07-15 DIAGNOSIS — Z9221 Personal history of antineoplastic chemotherapy: Secondary | ICD-10-CM | POA: Insufficient documentation

## 2022-07-15 LAB — COMPREHENSIVE METABOLIC PANEL
ALT: 28 U/L (ref 0–44)
AST: 30 U/L (ref 15–41)
Albumin: 3.7 g/dL (ref 3.5–5.0)
Alkaline Phosphatase: 94 U/L (ref 38–126)
Anion gap: 7 (ref 5–15)
BUN: 14 mg/dL (ref 6–20)
CO2: 26 mmol/L (ref 22–32)
Calcium: 9.1 mg/dL (ref 8.9–10.3)
Chloride: 110 mmol/L (ref 98–111)
Creatinine, Ser: 0.87 mg/dL (ref 0.44–1.00)
GFR, Estimated: >60 mL/min (ref >60)
Glucose, Bld: 115 mg/dL — ABNORMAL HIGH (ref 70–99)
Potassium: 3.4 mmol/L — ABNORMAL LOW (ref 3.5–5.1)
Sodium: 143 mmol/L (ref 135–145)
Total Bilirubin: 1 mg/dL (ref 0.3–1.2)
Total Protein: 7.1 g/dL (ref 6.5–8.1)

## 2022-07-15 LAB — CBC WITH DIFFERENTIAL/PLATELET
Abs Immature Granulocytes: 0 10*3/uL (ref 0.00–0.07)
Basophils Absolute: 0 10*3/uL (ref 0.0–0.1)
Basophils Relative: 1 %
Eosinophils Absolute: 0.3 10*3/uL (ref 0.0–0.5)
Eosinophils Relative: 8 %
HCT: 35.8 % — ABNORMAL LOW (ref 36.0–46.0)
Hemoglobin: 11.7 g/dL — ABNORMAL LOW (ref 12.0–15.0)
Immature Granulocytes: 0 %
Lymphocytes Relative: 36 %
Lymphs Abs: 1.4 10*3/uL (ref 0.7–4.0)
MCH: 28.3 pg (ref 26.0–34.0)
MCHC: 32.7 g/dL (ref 30.0–36.0)
MCV: 86.7 fL (ref 80.0–100.0)
Monocytes Absolute: 0.3 10*3/uL (ref 0.1–1.0)
Monocytes Relative: 7 %
Neutro Abs: 1.8 10*3/uL (ref 1.7–7.7)
Neutrophils Relative %: 48 %
Platelets: 193 10*3/uL (ref 150–400)
RBC: 4.13 MIL/uL (ref 3.87–5.11)
RDW: 13 % (ref 11.5–15.5)
WBC: 3.8 10*3/uL — ABNORMAL LOW (ref 4.0–10.5)
nRBC: 0 % (ref 0.0–0.2)

## 2022-07-15 NOTE — Assessment & Plan Note (Signed)
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  01/2021: Left upper outer quadrant biopsy shows grade 3, 3 cm invasive ductal carcinoma triple negative.  Treatment Plan:  1. Neoadjuvant chemotherapy with pembrolizumab, carboplatin, Taxol followed by pembrolizumab, Adriamycin, Cytoxan. 2. Followed by breast conserving surgery with sentinel lymph node study 3. Followed by adjuvant radiation therapy No role for antiestrogen therapy since this is a triple negative breast cancer 4. Adjuvant immunotherapy for 9 cycles.  She completed C4D8 and cancelled her last treatment of carbotaxol. She went on to surgery because chemo side effects were intolerable. Final pathology showed IDC with medullary features, one LN with macromets, prognostics are triple neg. We discussed standard of care is adj radiation and adj immunotherapy We discussed that the risk of recurrence is higher without adjuvant treatment, and occasionally breast cancer can return in distant organs and this unfortunately becomes metastatic breast cancer and this is incurable. She refused any further treatment and is now here for follow-up for surveillance.  She is doing quite well today.  She is in great spirits, working again, gained some weight, focusing on health.  No concerns at all.  She is due for mammogram, have ordered this today.  No concerns on physical exam.  Bilateral breast inspected and palpated.  At this time but recommended her to proceed with the mammogram, do some routine labs today and return to clinic in 6 months.  Thereafter I will try to alternate with Dr. Dwain Sarna for follow-up.

## 2022-07-15 NOTE — Progress Notes (Signed)
[facility] Cancer Follow up:    [place], [facility] [address], [address] [place] [address]   DIAGNOSIS:  Cancer Staging  Malignant neoplasm of upper-outer quadrant of left breast in female, estrogen receptor negative (HCC) Staging form: Breast, AJCC 8th Edition - Clinical stage from 03/07/2021: Stage IIB (cT2, cN0, cM0, G3, ER-, PR-, HER2-) - Signed by Serena Croissant, MD on 03/07/2021 Stage prefix: Initial diagnosis Histologic grading system: 3 grade system   SUMMARY OF ONCOLOGIC HISTORY: Oncology History  Malignant neoplasm of upper-outer quadrant of left breast in female, estrogen receptor negative (HCC)  02/12/2021 Initial Diagnosis   palpable solid mass in the left upper outer quadrant. Diagnostic mammogram on 02/12/2021 showed a 3 cm mass in the left upper outer quadrant. Biopsy on 02/20/2021 showed grade 3 invasive ductal carcinoma, ER/PR/Her2-([facility])   03/07/2021 Cancer Staging   Staging form: Breast, AJCC 8th Edition - Clinical stage from 03/07/2021: Stage IIB (cT2, cN0, cM0, G3, ER-, PR-, HER2-) - Signed by Serena Croissant, MD on 03/07/2021 Stage prefix: Initial diagnosis Histologic grading system: 3 grade system   03/28/2021 -  Chemotherapy   Patient is on Treatment Plan : BREAST Pembrolizumab (200) D1 + Carboplatin (1.5) D1,8,15 + Paclitaxel (80) D1,8,15 q21d X 4 cycles / Pembrolizumab (200) D1 + AC D1 q21d x 4 cycles      CURRENT THERAPY: Observation.  INTERVAL HISTORY:  She didn't want to complete neoadj chemotherapy because of intense itching that almost made her cry along with some burning pain. So she went on to proceed with surgery, final pathology showed 33 mm IDC with medullary feature, one LN with metastatic carcinoma. She had re excision of margin which was finally negative. She refused additional chemo or radiation. Rest of the pertinent 10 point ROS reviewed and negative. She is not on any anti estrogen therapy. She is  doing well. She denies any complaints at all. She is due for a mammogram. She has been eating healthy, gaining some weight.  She denies any change in her breast.  No change in breathing, bowel habits, urinary habits.  No new neurological complaints.  Overall she feels so much better and hoping to continue surveillance.  Rest of the pertinent 10 point ROS reviewed and negative  Patient Active Problem List   Diagnosis Date Noted   Neuropathic pruritus 05/30/2021   Port-A-Cath in place 05/09/2021   Malignant neoplasm of upper-outer quadrant of left breast in female, estrogen receptor negative (HCC) 03/06/2021    is allergic to benadryl [diphenhydramine].  MEDICAL HISTORY: Past Medical History:  Diagnosis Date   Breast cancer [facility])     SURGICAL HISTORY: Past Surgical History:  Procedure Laterality Date   AXILLARY SENTINEL NODE BIOPSY Left 08/26/2021   Procedure: LEFT AXILLARY SENTINEL NODE BIOPSY;  Surgeon: Emelia Loron, MD;  Location: [facility] OR;  Service: General;  Laterality: Left;   BREAST LUMPECTOMY WITH SENTINEL LYMPH NODE BIOPSY Left 08/26/2021   Procedure: LEFT BREAST LUMPECTOMY WITH, INJECTION BLUE DYE AND MAGTRACE;  Surgeon: Emelia Loron, MD;  Location: [facility] OR;  Service: General;  Laterality: Left;  GEN w/ PEC BLOCK   CESAREAN SECTION     PORT-A-CATH REMOVAL Right 09/30/2021   Procedure: REMOVAL PORT-A-CATH;  Surgeon: Emelia Loron, MD;  Location: [facility];  Service: General;  Laterality: Right;   PORTACATH PLACEMENT Right 03/18/2021   Procedure: INSERTION PORT-A-CATH;  Surgeon: Emelia Loron, MD;  Location: [facility];  Service: General;  Laterality: Right;  RE-EXCISION OF BREAST LUMPECTOMY Left 09/30/2021   Procedure: RE-EXCISION OF LEFT BREAST LUMPECTOMY;  Surgeon: Emelia Loron, MD;  Location: [facility];  Service: General;  Laterality: Left;   TUBAL LIGATION     WRIST FRACTURE SURGERY      SOCIAL  HISTORY: Social History   Socioeconomic History   Marital status: Divorced    Spouse name: Not on file   Number of children: Not on file   Years of education: Not on file   Highest education level: Not on file  Occupational History   Not on file  Tobacco Use   Smoking status: Never   Smokeless tobacco: Never  Vaping Use   Vaping Use: Never used  Substance and Sexual Activity   Alcohol use: Never   Drug use: Never   Sexual activity: Yes    Birth control/protection: Post-menopausal  Other Topics Concern   Not on file  Social History Narrative   Not on file   Social Determinants of Health   Financial Resource Strain: Not on file  Food Insecurity: Not on file  Transportation Needs: Not on file  Physical Activity: Not on file  Stress: Not on file  Social Connections: Not on file  Intimate Partner Violence: Not on file    FAMILY HISTORY: Family History  Problem Relation Age of Onset   Breast cancer Sister     Review of Systems  Constitutional:  Negative for appetite change, chills, fatigue, fever and unexpected weight change.  HENT:   Negative for hearing loss, lump/mass and trouble swallowing.   Eyes:  Negative for eye problems and icterus.  Respiratory:  Negative for chest tightness, cough and shortness of breath.   Cardiovascular:  Negative for chest pain, leg swelling and palpitations.  Gastrointestinal:  Negative for abdominal distention, abdominal pain, constipation, diarrhea, nausea and vomiting.  Endocrine: Negative for hot flashes.  Genitourinary:  Negative for difficulty urinating.   Musculoskeletal:  Negative for arthralgias.  Skin:  Negative for itching and rash.  Neurological:  Negative for dizziness, extremity weakness, headaches and numbness.  Hematological:  Negative for adenopathy. Does not bruise/bleed easily.  Psychiatric/Behavioral:  Negative for depression. The patient is not nervous/anxious.       PHYSICAL EXAMINATION  ECOG PERFORMANCE  STATUS: 1 - Symptomatic but completely ambulatory  Vitals:   07/15/22 1335  BP: 117/72  Pulse: 90  Resp: 14  Temp: 97.8 F (36.6 C)  SpO2: 98%    Physical Exam Constitutional:      Appearance: Normal appearance.  Eyes:     General: No scleral icterus. Chest:     Comments: Left breast post surgical changes. No palpable masses or regional adenopathy Musculoskeletal:     Cervical back: Normal range of motion. No rigidity.  Lymphadenopathy:     Cervical: No cervical adenopathy.  Skin:    General: Skin is warm and dry.  Neurological:     Mental Status: She is alert.     LABORATORY DATA:  CBC    Component Value Date/Time   WBC 3.6 (L) 08/22/2021 0900   RBC 3.91 08/22/2021 0900   HGB 11.3 (L) 08/22/2021 0900   HGB 9.7 (L) 06/13/2021 0825   HCT 35.5 (L) 08/22/2021 0900   PLT 170 08/22/2021 0900   PLT 227 06/13/2021 0825   MCV 90.8 08/22/2021 0900   MCH 28.9 08/22/2021 0900   MCHC 31.8 08/22/2021 0900   RDW 13.7 08/22/2021 0900   LYMPHSABS 1.1 06/13/2021 0825   MONOABS 0.3 06/13/2021  0825   EOSABS 0.1 06/13/2021 0825   BASOSABS 0.0 06/13/2021 0825    CMP     Component Value Date/Time   NA 139 08/22/2021 0900   K 4.4 08/22/2021 0900   CL 104 08/22/2021 0900   CO2 29 08/22/2021 0900   GLUCOSE 100 (H) 08/22/2021 0900   BUN 14 08/22/2021 0900   CREATININE 0.99 08/22/2021 0900   CREATININE 0.87 06/13/2021 0825   CALCIUM 9.4 08/22/2021 0900   PROT 7.7 06/13/2021 0825   ALBUMIN 3.9 06/13/2021 0825   AST 28 06/13/2021 0825   ALT 23 06/13/2021 0825   ALKPHOS 83 06/13/2021 0825   BILITOT 0.8 06/13/2021 0825   GFRNONAA >60 08/22/2021 0900   GFRNONAA >60 06/13/2021 0825     CASE: [id number] PATIENT: Amanda Smith Surgical Pathology Report   Reason for Addendum #1:  Breast Biomarker Results Reason for Addendum #2:  Molecular Genetic Test Results, FISH  Clinical History: left breast cancer (cm)     FINAL MICROSCOPIC DIAGNOSIS:  A.   BREAST, LEFT,  LUMPECTOMY: -    Invasive carcinoma with medullary features, grade 3, 33 mm in greatest dimension. -    Margin status:  Invasive carcinoma present at margin (superior), 0.6 mm focus, and at inferior resection edge (not true margin-additional inferior margin tissue submitted as Specimen H).,  B.   LYMPH NODE, LEFT AXILLARY, SENTINEL, EXCISION: -    Lymph node with metastatic carcinoma, 4 mm focus (1/1).  C.   LYMPH NODE, LEFT AXILLARY, SENTINEL, EXCISION: -    Lymph node without identified malignancy (0/1).  D.   LYMPH NODE, LEFT AXILLARY, SENTINEL, EXCISION: -    Lymph node without identified malignancy (0/1).  E.   LYMPH NODE, LEFT AXILLARY, SENTINEL, EXCISION: -    Lymph node without identified malignancy (0/1).  F.   LYMPH NODE, LEFT AXILLARY, SENTINEL, EXCISION: -    Lymph node without identified malignancy (0/1).  G.   LYMPH NODE, LEFT AXILLARY, SENTINEL, EXCISION: -    Lymph node without identified malignancy (0/1).  H.   BREAST, LEFT ADDITIONAL INFERIOR MARGIN, EXCISION: -    Breast tissue with no malignancy identified.  I.   BREAST, LEFT ADDITIONAL MEDIAL MARGIN, EXCISION: -    Breast tissue with no malignancy identified.   ONCOLOGY TABLE:   INVASIVE CARCINOMA OF THE BREAST:  Resection  Procedure: Excision (less than total mastectomy) Specimen Laterality: Left Histologic Type: Invasive carcinoma with medullary features. Histologic Grade:      Glandular (Acinar)/Tubular Differentiation: Score 3      Nuclear Pleomorphism: Score 3      Mitotic Rate: Score 3      Overall Grade: Grade 3 Tumor Size:  33 mm in greatest dimension Ductal Carcinoma In Situ: Not identified. Tumor Extent: Not applicable (skin, nipple and skeletal muscle are absent (nipple and skeletal muscle) or uninvolved (skin)) Treatment Effect in the Breast: No known presurgical therapy Margins: Invasive carcinoma present at margin      Specify Margin involved by invasive carcinoma: Superior margin,  0.6 mm focus. DCIS Margins: Not applicable Regional Lymph Nodes: Regional lymph nodes present. Tumor present in regional lymph node.      Number of Lymph Nodes Examined: 6      Number of Sentinel Nodes Examined: 6      Number of Lymph Nodes with Macrometastases (>2 mm): 1      Number of Lymph Nodes with Micrometastases: 0      Number of Lymph Nodes with Isolated  Tumor Cells (=0.2 mm or =200 cells): 0      Size of Largest Metastatic Deposit (mm): Not applicable      Extranodal Extension: Not applicable Distant Metastasis:      Distant Site(s) Involved: Not applicable Pathologic Stage Classification (pTNM, AJCC 8th Edition): pT2, pN1a Representative Tumor Block: A1 Comment(s):  Foci of suspected squamous differentiation are p40 non-reactive (squamous marker).   Estrogen Receptor:       Negative  Progesterone Receptor:   Negative  Proliferation Marker Ki-67:   40%  GROUP 5:   HER2 **NEGATIVE**   ASSESSMENT and THERAPY PLAN:   Malignant neoplasm of upper-outer quadrant of left breast in female, estrogen receptor negative (HCC) Amanda Smith is a 57 year old woman with 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  01/2021: Left upper outer quadrant biopsy shows grade 3, 3 cm invasive ductal carcinoma triple negative.  Treatment Plan:  1. Neoadjuvant chemotherapy with pembrolizumab, carboplatin, Taxol followed by pembrolizumab, Adriamycin, Cytoxan. 2. Followed by breast conserving surgery with sentinel lymph node study 3. Followed by adjuvant radiation therapy No role for antiestrogen therapy since this is a triple negative breast cancer 4. Adjuvant immunotherapy for 9 cycles.  She completed C4D8 and cancelled her last treatment of carbotaxol. She went on to surgery because chemo side effects were intolerable. Final pathology showed IDC with medullary features, one LN with macromets, prognostics are triple  neg. We discussed standard of care is adj radiation and adj immunotherapy We discussed that the risk of recurrence is higher without adjuvant treatment, and occasionally breast cancer can return in distant organs and this unfortunately becomes metastatic breast cancer and this is incurable. She refused any further treatment and is now here for follow-up for surveillance.  She is doing quite well today.  She is in great spirits, working again, gained some weight, focusing on health.  No concerns at all.  She is due for mammogram, have ordered this today.  No concerns on physical exam.  Bilateral breast inspected and palpated.  At this time but recommended her to proceed with the mammogram, do some routine labs today and return to clinic in 6 months.  Thereafter I will try to alternate with Dr. Dwain Sarna for follow-up. Total encounter time: 30 minutes in face-to-face visit time, chart review, lab review, care coordination, order entry, and documentation of the encounter.  *Total Encounter Time as defined by the Centers for Medicare and Medicaid Services includes, in addition to the face-to-face time of a patient visit (documented in the note above) non-face-to-face time: obtaining and reviewing outside history, ordering and reviewing medications, tests or procedures, care coordination (communications with other health care professionals or caregivers) and documentation in the medical record.

## 2022-07-16 ENCOUNTER — Other Ambulatory Visit: Payer: Self-pay

## 2022-07-23 ENCOUNTER — Other Ambulatory Visit: Payer: Self-pay | Admitting: General Surgery

## 2022-07-23 DIAGNOSIS — Z853 Personal history of malignant neoplasm of breast: Secondary | ICD-10-CM

## 2022-10-28 ENCOUNTER — Telehealth: Payer: Self-pay | Admitting: Hematology and Oncology

## 2022-10-28 NOTE — Telephone Encounter (Signed)
Patient is aware of rescheduled appointment times/dates 

## 2022-10-29 ENCOUNTER — Other Ambulatory Visit: Payer: Self-pay

## 2022-12-07 ENCOUNTER — Ambulatory Visit: Payer: Self-pay | Admitting: Hematology and Oncology

## 2022-12-08 ENCOUNTER — Other Ambulatory Visit: Payer: Self-pay | Admitting: Hematology and Oncology

## 2022-12-11 ENCOUNTER — Inpatient Hospital Stay: Payer: Self-pay | Attending: Hematology and Oncology | Admitting: Hematology and Oncology

## 2022-12-11 VITALS — BP 110/65 | HR 101 | Temp 97.8°F | Resp 16 | Wt 127.2 lb

## 2022-12-11 DIAGNOSIS — Z9221 Personal history of antineoplastic chemotherapy: Secondary | ICD-10-CM | POA: Insufficient documentation

## 2022-12-11 DIAGNOSIS — Z803 Family history of malignant neoplasm of breast: Secondary | ICD-10-CM | POA: Insufficient documentation

## 2022-12-11 DIAGNOSIS — Z171 Estrogen receptor negative status [ER-]: Secondary | ICD-10-CM | POA: Insufficient documentation

## 2022-12-11 DIAGNOSIS — F458 Other somatoform disorders: Secondary | ICD-10-CM | POA: Insufficient documentation

## 2022-12-11 DIAGNOSIS — C50412 Malignant neoplasm of upper-outer quadrant of left female breast: Secondary | ICD-10-CM | POA: Insufficient documentation

## 2022-12-11 NOTE — Assessment & Plan Note (Signed)
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  01/2021: Left upper outer quadrant biopsy shows grade 3, 3 cm invasive ductal carcinoma triple negative.  Treatment Plan:  1. Neoadjuvant chemotherapy with pembrolizumab, carboplatin, Taxol followed by pembrolizumab, Adriamycin, Cytoxan. 2. Followed by breast conserving surgery with sentinel lymph node study 3. Followed by adjuvant radiation therapy No role for antiestrogen therapy since this is a triple negative breast cancer 4. Adjuvant immunotherapy for 9 cycles.  She completed C4D8 and cancelled her last treatment of carbotaxol. She went on to surgery because chemo side effects were intolerable. Final pathology showed IDC with medullary features, one LN with macromets, prognostics are triple neg. We discussed standard of care is adj radiation and adj immunotherapy We discussed that the risk of recurrence is higher without adjuvant treatment, and occasionally breast cancer can return in distant organs and this unfortunately becomes metastatic breast cancer and this is incurable. She refused any further treatment and is now here for follow-up for surveillance.  She is doing quite well.  No concerns for recurrence on breast exam.  Review of systems and physical examination unremarkable.  She is overdue for mammogram, encouraged her to schedule this.  If she is unable to pay for this, I have discussed that I can speak to the social worker team to see if we can get any grants.  She tells me that she is able to pay for this.  She should otherwise return to clinic for follow-up in 6 months or sooner as needed.

## 2022-12-11 NOTE — Progress Notes (Signed)
[facility] Cancer Follow up:    [place], [facility] [address], [address] [place] [address]   DIAGNOSIS:  Cancer Staging  Malignant neoplasm of upper-outer quadrant of left breast in female, estrogen receptor negative (HCC) Staging form: Breast, AJCC 8th Edition - Clinical stage from 03/07/2021: Stage IIB (cT2, cN0, cM0, G3, ER-, PR-, HER2-) - Signed by Serena Croissant, MD on 03/07/2021 Stage prefix: Initial diagnosis Histologic grading system: 3 grade system   SUMMARY OF ONCOLOGIC HISTORY: Oncology History  Malignant neoplasm of upper-outer quadrant of left breast in female, estrogen receptor negative (HCC)  02/12/2021 Initial Diagnosis   palpable solid mass in the left upper outer quadrant. Diagnostic mammogram on 02/12/2021 showed a 3 cm mass in the left upper outer quadrant. Biopsy on 02/20/2021 showed grade 3 invasive ductal carcinoma, ER/PR/Her2-([facility])   03/07/2021 Cancer Staging   Staging form: Breast, AJCC 8th Edition - Clinical stage from 03/07/2021: Stage IIB (cT2, cN0, cM0, G3, ER-, PR-, HER2-) - Signed by Serena Croissant, MD on 03/07/2021 Stage prefix: Initial diagnosis Histologic grading system: 3 grade system   03/28/2021 - 06/13/2021 Chemotherapy   Patient is on Treatment Plan : BREAST Pembrolizumab (200) D1 + Carboplatin (1.5) D1,8,15 + Paclitaxel (80) D1,8,15 q21d X 4 cycles / Pembrolizumab (200) D1 + AC D1 q21d x 4 cycles      CURRENT THERAPY: Observation.  INTERVAL HISTORY:  She didn't want to complete neoadj chemotherapy because of intense itching that almost made her cry along with some burning pain. So she went on to proceed with surgery, final pathology showed 33 mm IDC with medullary feature, one LN with metastatic carcinoma. She had re excision of margin which was finally negative. She refused additional chemo or radiation.  She presents for a routine follow-up after a six-month interval. She reports feeling well with no  new or ongoing health concerns. She denies any recent doctor visits, changes in bowel habits, breathing difficulties, urinary issues, headaches, or falls. She also denies any changes in her breasts. She has not experienced any itching recently.  The patient acknowledges that she is overdue for a mammogram and has not yet scheduled it due to lack of insurance. She expresses a desire to catch up on her health maintenance tasks, including scheduling the mammogram.  Rest of the pertinent 10 point ROS reviewed and negative  Patient Active Problem List   Diagnosis Date Noted   Neuropathic pruritus 05/30/2021   Port-A-Cath in place 05/09/2021   Malignant neoplasm of upper-outer quadrant of left breast in female, estrogen receptor negative (HCC) 03/06/2021    is allergic to benadryl [diphenhydramine].  MEDICAL HISTORY: Past Medical History:  Diagnosis Date   Breast cancer [facility])     SURGICAL HISTORY: Past Surgical History:  Procedure Laterality Date   AXILLARY SENTINEL NODE BIOPSY Left 08/26/2021   Procedure: LEFT AXILLARY SENTINEL NODE BIOPSY;  Surgeon: Emelia Loron, MD;  Location: [facility] OR;  Service: General;  Laterality: Left;   BREAST LUMPECTOMY WITH SENTINEL LYMPH NODE BIOPSY Left 08/26/2021   Procedure: LEFT BREAST LUMPECTOMY WITH, INJECTION BLUE DYE AND MAGTRACE;  Surgeon: Emelia Loron, MD;  Location: [facility] OR;  Service: General;  Laterality: Left;  GEN w/ PEC BLOCK   CESAREAN SECTION     PORT-A-CATH REMOVAL Right 09/30/2021   Procedure: REMOVAL PORT-A-CATH;  Surgeon: Emelia Loron, MD;  Location: [facility];  Service: General;  Laterality: Right;   PORTACATH PLACEMENT Right 03/18/2021   Procedure: INSERTION PORT-A-CATH;  Surgeon: Emelia Loron, MD;  Location: [facility];  Service: General;  Laterality: Right;   RE-EXCISION OF BREAST LUMPECTOMY Left 09/30/2021   Procedure: RE-EXCISION OF LEFT BREAST LUMPECTOMY;  Surgeon: Emelia Loron, MD;   Location: [facility];  Service: General;  Laterality: Left;   TUBAL LIGATION     WRIST FRACTURE SURGERY      SOCIAL HISTORY: Social History   Socioeconomic History   Marital status: Divorced    Spouse name: Not on file   Number of children: Not on file   Years of education: Not on file   Highest education level: Not on file  Occupational History   Not on file  Tobacco Use   Smoking status: Never   Smokeless tobacco: Never  Vaping Use   Vaping status: Never Used  Substance and Sexual Activity   Alcohol use: Never   Drug use: Never   Sexual activity: Yes    Birth control/protection: Post-menopausal  Other Topics Concern   Not on file  Social History Narrative   Not on file   Social Determinants of Health   Financial Resource Strain: Not on file  Food Insecurity: Not on file  Transportation Needs: Not on file  Physical Activity: Not on file  Stress: Not on file  Social Connections: Unknown (06/10/2021)   Received from [facility], [facility]   Social Network    Social Network: Not on file  Intimate Partner Violence: Unknown (05/02/2021)   Received from [facility], [facility]   HITS    Physically Hurt: Not on file    Insult or Talk Down To: Not on file    Threaten Physical Harm: Not on file    Scream or Curse: Not on file    FAMILY HISTORY: Family History  Problem Relation Age of Onset   Breast cancer Sister     Review of Systems  Constitutional:  Negative for appetite change, chills, fatigue, fever and unexpected weight change.  HENT:   Negative for hearing loss, lump/mass and trouble swallowing.   Eyes:  Negative for eye problems and icterus.  Respiratory:  Negative for chest tightness, cough and shortness of breath.   Cardiovascular:  Negative for chest pain, leg swelling and palpitations.  Gastrointestinal:  Negative for abdominal distention, abdominal pain, constipation, diarrhea, nausea and vomiting.  Endocrine: Negative for hot  flashes.  Genitourinary:  Negative for difficulty urinating.   Musculoskeletal:  Negative for arthralgias.  Skin:  Negative for itching and rash.  Neurological:  Negative for dizziness, extremity weakness, headaches and numbness.  Hematological:  Negative for adenopathy. Does not bruise/bleed easily.  Psychiatric/Behavioral:  Negative for depression. The patient is not nervous/anxious.       PHYSICAL EXAMINATION  ECOG PERFORMANCE STATUS: 1 - Symptomatic but completely ambulatory  Vitals:   12/11/22 1129  BP: 110/65  Pulse: (!) 101  Resp: 16  Temp: 97.8 F (36.6 C)  SpO2: 100%    Physical Exam Constitutional:      Appearance: Normal appearance.  Eyes:     General: No scleral icterus. Chest:     Comments: Left breast post surgical changes. No palpable masses or regional adenopathy Musculoskeletal:     Cervical back: Normal range of motion. No rigidity.  Lymphadenopathy:     Cervical: No cervical adenopathy.  Skin:    General: Skin is warm and dry.  Neurological:     Mental Status: She is alert.     LABORATORY DATA:  CBC  Component Value Date/Time   WBC 3.8 (L) 07/15/2022 1353   RBC 4.13 07/15/2022 1353   HGB 11.7 (L) 07/15/2022 1353   HGB 9.7 (L) 06/13/2021 0825   HCT 35.8 (L) 07/15/2022 1353   PLT 193 07/15/2022 1353   PLT 227 06/13/2021 0825   MCV 86.7 07/15/2022 1353   MCH 28.3 07/15/2022 1353   MCHC 32.7 07/15/2022 1353   RDW 13.0 07/15/2022 1353   LYMPHSABS 1.4 07/15/2022 1353   MONOABS 0.3 07/15/2022 1353   EOSABS 0.3 07/15/2022 1353   BASOSABS 0.0 07/15/2022 1353    CMP     Component Value Date/Time   NA 143 07/15/2022 1353   K 3.4 (L) 07/15/2022 1353   CL 110 07/15/2022 1353   CO2 26 07/15/2022 1353   GLUCOSE 115 (H) 07/15/2022 1353   BUN 14 07/15/2022 1353   CREATININE 0.87 07/15/2022 1353   CREATININE 0.87 06/13/2021 0825   CALCIUM 9.1 07/15/2022 1353   PROT 7.1 07/15/2022 1353   ALBUMIN 3.7 07/15/2022 1353   AST 30 07/15/2022  1353   AST 28 06/13/2021 0825   ALT 28 07/15/2022 1353   ALT 23 06/13/2021 0825   ALKPHOS 94 07/15/2022 1353   BILITOT 1.0 07/15/2022 1353   BILITOT 0.8 06/13/2021 0825   GFRNONAA >60 07/15/2022 1353   GFRNONAA >60 06/13/2021 0825     CASE: [id number] PATIENT: Amanda Smith Surgical Pathology Report   Reason for Addendum #1:  Breast Biomarker Results Reason for Addendum #2:  Molecular Genetic Test Results, FISH  Clinical History: left breast cancer (cm)     FINAL MICROSCOPIC DIAGNOSIS:  A.   BREAST, LEFT, LUMPECTOMY: -    Invasive carcinoma with medullary features, grade 3, 33 mm in greatest dimension. -    Margin status:  Invasive carcinoma present at margin (superior), 0.6 mm focus, and at inferior resection edge (not true margin-additional inferior margin tissue submitted as Specimen H).,  B.   LYMPH NODE, LEFT AXILLARY, SENTINEL, EXCISION: -    Lymph node with metastatic carcinoma, 4 mm focus (1/1).  C.   LYMPH NODE, LEFT AXILLARY, SENTINEL, EXCISION: -    Lymph node without identified malignancy (0/1).  D.   LYMPH NODE, LEFT AXILLARY, SENTINEL, EXCISION: -    Lymph node without identified malignancy (0/1).  E.   LYMPH NODE, LEFT AXILLARY, SENTINEL, EXCISION: -    Lymph node without identified malignancy (0/1).  F.   LYMPH NODE, LEFT AXILLARY, SENTINEL, EXCISION: -    Lymph node without identified malignancy (0/1).  G.   LYMPH NODE, LEFT AXILLARY, SENTINEL, EXCISION: -    Lymph node without identified malignancy (0/1).  H.   BREAST, LEFT ADDITIONAL INFERIOR MARGIN, EXCISION: -    Breast tissue with no malignancy identified.  I.   BREAST, LEFT ADDITIONAL MEDIAL MARGIN, EXCISION: -    Breast tissue with no malignancy identified.   ONCOLOGY TABLE:   INVASIVE CARCINOMA OF THE BREAST:  Resection  Procedure: Excision (less than total mastectomy) Specimen Laterality: Left Histologic Type: Invasive carcinoma with medullary features. Histologic Grade:       Glandular (Acinar)/Tubular Differentiation: Score 3      Nuclear Pleomorphism: Score 3      Mitotic Rate: Score 3      Overall Grade: Grade 3 Tumor Size:  33 mm in greatest dimension Ductal Carcinoma In Situ: Not identified. Tumor Extent: Not applicable (skin, nipple and skeletal muscle are absent (nipple and skeletal muscle) or uninvolved (skin)) Treatment Effect in the  Breast: No known presurgical therapy Margins: Invasive carcinoma present at margin      Specify Margin involved by invasive carcinoma: Superior margin, 0.6 mm focus. DCIS Margins: Not applicable Regional Lymph Nodes: Regional lymph nodes present. Tumor present in regional lymph node.      Number of Lymph Nodes Examined: 6      Number of Sentinel Nodes Examined: 6      Number of Lymph Nodes with Macrometastases (>2 mm): 1      Number of Lymph Nodes with Micrometastases: 0      Number of Lymph Nodes with Isolated Tumor Cells (=0.2 mm or =200 cells): 0      Size of Largest Metastatic Deposit (mm): Not applicable      Extranodal Extension: Not applicable Distant Metastasis:      Distant Site(s) Involved: Not applicable Pathologic Stage Classification (pTNM, AJCC 8th Edition): pT2, pN1a Representative Tumor Block: A1 Comment(s):  Foci of suspected squamous differentiation are p40 non-reactive (squamous marker).   Estrogen Receptor:       Negative  Progesterone Receptor:   Negative  Proliferation Marker Ki-67:   40%  GROUP 5:   HER2 **NEGATIVE**   ASSESSMENT and THERAPY PLAN:   Malignant neoplasm of upper-outer quadrant of left breast in female, estrogen receptor negative (HCC) Amanda Smith is a 57 year old woman with 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  01/2021: Left upper outer quadrant biopsy shows grade 3, 3 cm invasive ductal carcinoma triple negative.  Treatment Plan:  1. Neoadjuvant chemotherapy with pembrolizumab, carboplatin,  Taxol followed by pembrolizumab, Adriamycin, Cytoxan. 2. Followed by breast conserving surgery with sentinel lymph node study 3. Followed by adjuvant radiation therapy No role for antiestrogen therapy since this is a triple negative breast cancer 4. Adjuvant immunotherapy for 9 cycles.  She completed C4D8 and cancelled her last treatment of carbotaxol. She went on to surgery because chemo side effects were intolerable. Final pathology showed IDC with medullary features, one LN with macromets, prognostics are triple neg. We discussed standard of care is adj radiation and adj immunotherapy We discussed that the risk of recurrence is higher without adjuvant treatment, and occasionally breast cancer can return in distant organs and this unfortunately becomes metastatic breast cancer and this is incurable. She refused any further treatment and is now here for follow-up for surveillance.  She is doing quite well.  No concerns for recurrence on breast exam.  Review of systems and physical examination unremarkable.  She is overdue for mammogram, encouraged her to schedule this.  If she is unable to pay for this, I have discussed that I can speak to the social worker team to see if we can get any grants.  She tells me that she is able to pay for this.  She should otherwise return to clinic for follow-up in 6 months or sooner as needed.  Total encounter time: 30 minutes in face-to-face visit time, chart review, lab review, care coordination, order entry, and documentation of the encounter.  *Total Encounter Time as defined by the Centers for Medicare and Medicaid Services includes, in addition to the face-to-face time of a patient visit (documented in the note above) non-face-to-face time: obtaining and reviewing outside history, ordering and reviewing medications, tests or procedures, care coordination (communications with other health care professionals or caregivers) and documentation in the medical  record.

## 2023-05-11 ENCOUNTER — Telehealth: Payer: Self-pay | Admitting: Hematology and Oncology

## 2023-05-11 NOTE — Telephone Encounter (Signed)
 Unable to reach pt, no vm available to leave message. Appt should update in MyChart

## 2023-05-15 IMAGING — RF DG C-ARM 1-60 MIN
1 series · 1 of 1 positions shown · non-contrast
Comparison: None.

CLINICAL DATA: Fluoroscopy for insertion of Port-A-Cath.

EXAM:
DG C-ARM 1-60 MIN
FLUOROSCOPY:
Fluoroscopy Time:  16.3 seconds
Radiation Exposure Index (if provided by the fluoroscopic device):
1.5 mGy
Number of Acquired Spot Images: 1

[Series 1: run · 1 of 1 slices shown]
[im 1/1]
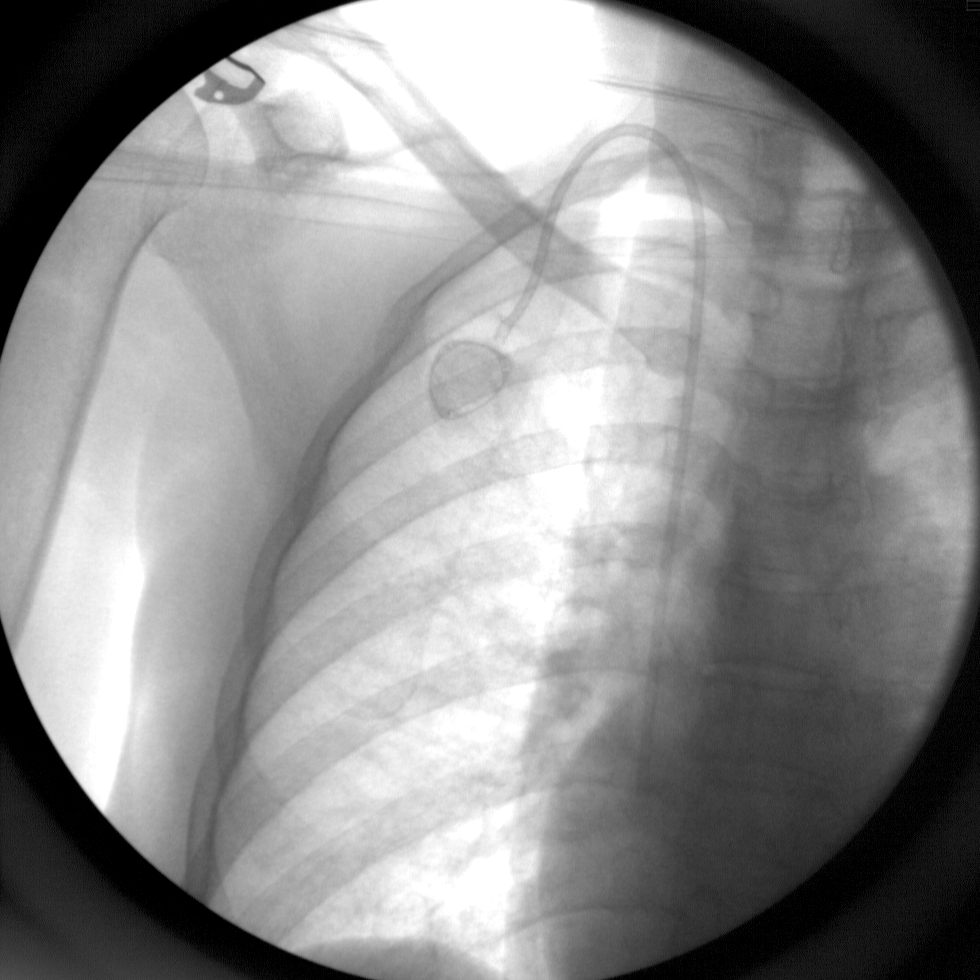

[1 of 1 positions shown; findings below may reference images not displayed]

FINDINGS: Single fluoroscopic spot view obtained demonstrating an internal
jugular Port-A-Cath with tip in the lower SVC.
IMPRESSION: Procedural fluoroscopy for Port-A-Cath placement.

## 2023-06-10 ENCOUNTER — Ambulatory Visit: Payer: Self-pay | Admitting: Hematology and Oncology

## 2023-06-22 ENCOUNTER — Telehealth: Payer: Self-pay

## 2023-06-22 NOTE — Telephone Encounter (Signed)
 Verbally confirmed appointment for 5/28

## 2023-06-23 ENCOUNTER — Inpatient Hospital Stay: Payer: Self-pay | Attending: Hematology and Oncology | Admitting: Hematology and Oncology

## 2023-06-23 ENCOUNTER — Inpatient Hospital Stay: Payer: Self-pay

## 2023-06-23 VITALS — BP 117/69 | HR 92 | Temp 98.0°F | Resp 17 | Wt 129.7 lb

## 2023-06-23 DIAGNOSIS — H532 Diplopia: Secondary | ICD-10-CM | POA: Insufficient documentation

## 2023-06-23 DIAGNOSIS — W19XXXA Unspecified fall, initial encounter: Secondary | ICD-10-CM | POA: Insufficient documentation

## 2023-06-23 DIAGNOSIS — Z171 Estrogen receptor negative status [ER-]: Secondary | ICD-10-CM | POA: Insufficient documentation

## 2023-06-23 DIAGNOSIS — C50412 Malignant neoplasm of upper-outer quadrant of left female breast: Secondary | ICD-10-CM

## 2023-06-23 DIAGNOSIS — R519 Headache, unspecified: Secondary | ICD-10-CM | POA: Insufficient documentation

## 2023-06-23 DIAGNOSIS — M898X9 Other specified disorders of bone, unspecified site: Secondary | ICD-10-CM | POA: Insufficient documentation

## 2023-06-23 LAB — CBC WITH DIFFERENTIAL/PLATELET
Abs Immature Granulocytes: 0.01 10*3/uL (ref 0.00–0.07)
Basophils Absolute: 0 10*3/uL (ref 0.0–0.1)
Basophils Relative: 1 %
Eosinophils Absolute: 0.3 10*3/uL (ref 0.0–0.5)
Eosinophils Relative: 6 %
HCT: 35.8 % — ABNORMAL LOW (ref 36.0–46.0)
Hemoglobin: 11.6 g/dL — ABNORMAL LOW (ref 12.0–15.0)
Immature Granulocytes: 0 %
Lymphocytes Relative: 38 %
Lymphs Abs: 1.6 10*3/uL (ref 0.7–4.0)
MCH: 28.2 pg (ref 26.0–34.0)
MCHC: 32.4 g/dL (ref 30.0–36.0)
MCV: 87.1 fL (ref 80.0–100.0)
Monocytes Absolute: 0.3 10*3/uL (ref 0.1–1.0)
Monocytes Relative: 7 %
Neutro Abs: 2.1 10*3/uL (ref 1.7–7.7)
Neutrophils Relative %: 48 %
Platelets: 181 10*3/uL (ref 150–400)
RBC: 4.11 MIL/uL (ref 3.87–5.11)
RDW: 13.2 % (ref 11.5–15.5)
WBC: 4.4 10*3/uL (ref 4.0–10.5)
nRBC: 0 % (ref 0.0–0.2)

## 2023-06-23 LAB — CMP (CANCER CENTER ONLY)
ALT: 19 U/L (ref 0–44)
AST: 25 U/L (ref 15–41)
Albumin: 4 g/dL (ref 3.5–5.0)
Alkaline Phosphatase: 86 U/L (ref 38–126)
Anion gap: 5 (ref 5–15)
BUN: 19 mg/dL (ref 6–20)
CO2: 30 mmol/L (ref 22–32)
Calcium: 9.3 mg/dL (ref 8.9–10.3)
Chloride: 107 mmol/L (ref 98–111)
Creatinine: 0.88 mg/dL (ref 0.44–1.00)
GFR, Estimated: >60 mL/min (ref >60)
Glucose, Bld: 104 mg/dL — ABNORMAL HIGH (ref 70–99)
Potassium: 4.2 mmol/L (ref 3.5–5.1)
Sodium: 142 mmol/L (ref 135–145)
Total Bilirubin: 0.9 mg/dL (ref 0.0–1.2)
Total Protein: 7.6 g/dL (ref 6.5–8.1)

## 2023-06-23 NOTE — Assessment & Plan Note (Addendum)
 Amanda Smith is a 58 year old woman with 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  01/2021: Left upper outer quadrant biopsy shows grade 3, 3 cm invasive ductal carcinoma triple negative.  Treatment Plan:  1. Neoadjuvant chemotherapy with pembrolizumab , carboplatin , Taxol  followed by pembrolizumab , Adriamycin, Cytoxan. 2. Followed by breast conserving surgery with sentinel lymph node study 3. Followed by adjuvant radiation therapy No role for antiestrogen therapy since this is a triple negative breast cancer 4. Adjuvant immunotherapy for 9 cycles.  She completed C4D8 and cancelled her last treatment of carbotaxol. She went on to surgery because chemo side effects were intolerable. Final pathology showed IDC with medullary features, one LN with macromets, prognostics are triple neg. We discussed standard of care is adj radiation and adj immunotherapy We discussed that the risk of recurrence is higher without adjuvant treatment, and occasionally breast cancer can return in distant organs and this unfortunately becomes metastatic breast cancer and this is incurable. She refused any further treatment and is now here for follow-up for surveillance.    Assessment and Plan Assessment & Plan Triple negative breast cancer No concern for recurrence based on ROS and PE Considered ctDNA testing for recurrence detection. She is agreeable - Perform ctDNA blood test today and repeat every six months. - Encourage scheduling of mammogram. - RTC in 6 months or sooner for follow up.

## 2023-06-23 NOTE — Progress Notes (Signed)
 [facility] Cancer Follow up:    [place] [address], [address] [place] [address]   DIAGNOSIS:  Cancer Staging  Malignant neoplasm of upper-outer quadrant of left breast in female, estrogen receptor negative (HCC) Staging form: Breast, AJCC 8th Edition - Clinical stage from 03/07/2021: Stage IIB (cT2, cN0, cM0, G3, ER-, PR-, HER2-) - Signed by Cameron Cea, MD on 03/07/2021 Stage prefix: Initial diagnosis Histologic grading system: 3 grade system   SUMMARY OF ONCOLOGIC HISTORY: Oncology History  Malignant neoplasm of upper-outer quadrant of left breast in female, estrogen receptor negative (HCC)  02/12/2021 Initial Diagnosis   palpable solid mass in the left upper outer quadrant. Diagnostic mammogram on 02/12/2021 showed a 3 cm mass in the left upper outer quadrant. Biopsy on 02/20/2021 showed grade 3 invasive ductal carcinoma, ER/PR/Her2-([facility])   03/07/2021 Cancer Staging   Staging form: Breast, AJCC 8th Edition - Clinical stage from 03/07/2021: Stage IIB (cT2, cN0, cM0, G3, ER-, PR-, HER2-) - Signed by Cameron Cea, MD on 03/07/2021 Stage prefix: Initial diagnosis Histologic grading system: 3 grade system   03/28/2021 - 06/13/2021 Chemotherapy   Patient is on Treatment Plan : BREAST Pembrolizumab  (200) D1 + Carboplatin  (1.5) D1,8,15 + Paclitaxel  (80) D1,8,15 q21d X 4 cycles / Pembrolizumab  (200) D1 + AC D1 q21d x 4 cycles      CURRENT THERAPY: Observation.  INTERVAL HISTORY:  She didn't want to complete neoadj chemotherapy because of intense itching that almost made her cry along with some burning pain. So she went on to proceed with surgery, final pathology showed 33 mm IDC with medullary feature, one LN with metastatic carcinoma. She had re excision of margin which was finally negative. She refused additional chemo or radiation.  She presents for a routine follow-up after a six-month interval. Discussed the use of AI scribe  software for clinical note transcription with the patient, who gave verbal consent to proceed.  History of Present Illness Eulah Hornaday is a 58 year old female with triple negative breast cancer who presents for follow-up.  She has been feeling well overall with no significant changes in her health status. No changes in breathing, bowel habits, urination, bone pain, headaches, double vision, memory changes, falls, or seizures. She has been waking up around 4:00 to 5:00 AM and needs to get up by 6:30 AM, which may be affecting her sleep quality, although she states, 'I sleep okay.'  She believes she has maintained or possibly lost some weight recently, attributing this to her recent ice cream consumption over the past two to three nights.  She acknowledges not having scheduled her mammogram despite intending to do so after receiving a call a couple of months ago.  Rest of the pertinent 10 point ROS reviewed and negative  Patient Active Problem List   Diagnosis Date Noted   Neuropathic pruritus 05/30/2021   Port-A-Cath in place 05/09/2021   Malignant neoplasm of upper-outer quadrant of left breast in female, estrogen receptor negative (HCC) 03/06/2021    is allergic to benadryl  [diphenhydramine ].  MEDICAL HISTORY: Past Medical History:  Diagnosis Date   Breast cancer [facility])     SURGICAL HISTORY: Past Surgical History:  Procedure Laterality Date   AXILLARY SENTINEL NODE BIOPSY Left 08/26/2021   Procedure: LEFT AXILLARY SENTINEL NODE BIOPSY;  Surgeon: Enid Harry, MD;  Location: [facility] OR;  Service: General;  Laterality: Left;   BREAST LUMPECTOMY WITH SENTINEL LYMPH NODE BIOPSY Left 08/26/2021   Procedure: LEFT BREAST  LUMPECTOMY WITH, INJECTION BLUE DYE AND MAGTRACE;  Surgeon: Enid Harry, MD;  Location: [facility] OR;  Service: General;  Laterality: Left;  GEN w/ PEC BLOCK   CESAREAN SECTION     PORT-A-CATH REMOVAL Right 09/30/2021   Procedure: REMOVAL PORT-A-CATH;  Surgeon: Enid Harry, MD;  Location: [facility];  Service: General;  Laterality: Right;   PORTACATH PLACEMENT Right 03/18/2021   Procedure: INSERTION PORT-A-CATH;  Surgeon: Enid Harry, MD;  Location: [facility];  Service: General;  Laterality: Right;   RE-EXCISION OF BREAST LUMPECTOMY Left 09/30/2021   Procedure: RE-EXCISION OF LEFT BREAST LUMPECTOMY;  Surgeon: Enid Harry, MD;  Location: [facility];  Service: General;  Laterality: Left;   TUBAL LIGATION     WRIST FRACTURE SURGERY      SOCIAL HISTORY: Social History   Socioeconomic History   Marital status: Divorced    Spouse name: Not on file   Number of children: Not on file   Years of education: Not on file   Highest education level: Not on file  Occupational History   Not on file  Tobacco Use   Smoking status: Never   Smokeless tobacco: Never  Vaping Use   Vaping status: Never Used  Substance and Sexual Activity   Alcohol use: Never   Drug use: Never   Sexual activity: Yes    Birth control/protection: Post-menopausal  Other Topics Concern   Not on file  Social History Narrative   Not on file   Social Drivers of Health   Financial Resource Strain: Not on file  Food Insecurity: Not on file  Transportation Needs: Not on file  Physical Activity: Not on file  Stress: Not on file  Social Connections: Unknown (06/10/2021)   Received from [facility], [facility]   Social Network    Social Network: Not on file  Intimate Partner Violence: Unknown (05/02/2021)   Received from [facility], [facility]   HITS    Physically Hurt: Not on file    Insult or Talk Down To: Not on file    Threaten Physical Harm: Not on file    Scream or Curse: Not on file    FAMILY HISTORY: Family History  Problem Relation Age of Onset   Breast cancer Sister     Review of Systems  Constitutional:  Negative for appetite change, chills, fatigue, fever and unexpected weight change.  HENT:    Negative for hearing loss, lump/mass and trouble swallowing.   Eyes:  Negative for eye problems and icterus.  Respiratory:  Negative for chest tightness, cough and shortness of breath.   Cardiovascular:  Negative for chest pain, leg swelling and palpitations.  Gastrointestinal:  Negative for abdominal distention, abdominal pain, constipation, diarrhea, nausea and vomiting.  Endocrine: Negative for hot flashes.  Genitourinary:  Negative for difficulty urinating.   Musculoskeletal:  Negative for arthralgias.  Skin:  Negative for itching and rash.  Neurological:  Negative for dizziness, extremity weakness, headaches and numbness.  Hematological:  Negative for adenopathy. Does not bruise/bleed easily.  Psychiatric/Behavioral:  Negative for depression. The patient is not nervous/anxious.       PHYSICAL EXAMINATION  ECOG PERFORMANCE STATUS: 1 - Symptomatic but completely ambulatory  Vitals:   06/23/23 1401  BP: 117/69  Pulse: 92  Resp: 17  Temp: 98 F (36.7 C)  SpO2: 99%    Physical Exam Constitutional:      Appearance: Normal appearance.  Eyes:     General: No scleral icterus.  Chest:     Comments: Left breast post surgical changes. No palpable masses or regional adenopathy Musculoskeletal:     Cervical back: Normal range of motion. No rigidity.  Lymphadenopathy:     Cervical: No cervical adenopathy.  Skin:    General: Skin is warm and dry.  Neurological:     Mental Status: She is alert.     LABORATORY DATA:  CBC    Component Value Date/Time   WBC 4.4 06/23/2023 1439   RBC 4.11 06/23/2023 1439   HGB 11.6 (L) 06/23/2023 1439   HGB 9.7 (L) 06/13/2021 0825   HCT 35.8 (L) 06/23/2023 1439   PLT 181 06/23/2023 1439   PLT 227 06/13/2021 0825   MCV 87.1 06/23/2023 1439   MCH 28.2 06/23/2023 1439   MCHC 32.4 06/23/2023 1439   RDW 13.2 06/23/2023 1439   LYMPHSABS 1.6 06/23/2023 1439   MONOABS 0.3 06/23/2023 1439   EOSABS 0.3 06/23/2023 1439   BASOSABS 0.0 06/23/2023  1439    CMP     Component Value Date/Time   NA 142 06/23/2023 1439   K 4.2 06/23/2023 1439   CL 107 06/23/2023 1439   CO2 30 06/23/2023 1439   GLUCOSE 104 (H) 06/23/2023 1439   BUN 19 06/23/2023 1439   CREATININE 0.88 06/23/2023 1439   CALCIUM 9.3 06/23/2023 1439   PROT 7.6 06/23/2023 1439   ALBUMIN 4.0 06/23/2023 1439   AST 25 06/23/2023 1439   ALT 19 06/23/2023 1439   ALKPHOS 86 06/23/2023 1439   BILITOT 0.9 06/23/2023 1439   GFRNONAA >60 06/23/2023 1439     CASE: [id number] PATIENT: Junetta Koob Surgical Pathology Report   Reason for Addendum #1:  Breast Biomarker Results Reason for Addendum #2:  Molecular Genetic Test Results, FISH  Clinical History: left breast cancer (cm)     FINAL MICROSCOPIC DIAGNOSIS:  A.   BREAST, LEFT, LUMPECTOMY: -    Invasive carcinoma with medullary features, grade 3, 33 mm in greatest dimension. -    Margin status:  Invasive carcinoma present at margin (superior), 0.6 mm focus, and at inferior resection edge (not true margin-additional inferior margin tissue submitted as Specimen H).,  B.   LYMPH NODE, LEFT AXILLARY, SENTINEL, EXCISION: -    Lymph node with metastatic carcinoma, 4 mm focus (1/1).  C.   LYMPH NODE, LEFT AXILLARY, SENTINEL, EXCISION: -    Lymph node without identified malignancy (0/1).  D.   LYMPH NODE, LEFT AXILLARY, SENTINEL, EXCISION: -    Lymph node without identified malignancy (0/1).  E.   LYMPH NODE, LEFT AXILLARY, SENTINEL, EXCISION: -    Lymph node without identified malignancy (0/1).  F.   LYMPH NODE, LEFT AXILLARY, SENTINEL, EXCISION: -    Lymph node without identified malignancy (0/1).  G.   LYMPH NODE, LEFT AXILLARY, SENTINEL, EXCISION: -    Lymph node without identified malignancy (0/1).  H.   BREAST, LEFT ADDITIONAL INFERIOR MARGIN, EXCISION: -    Breast tissue with no malignancy identified.  I.   BREAST, LEFT ADDITIONAL MEDIAL MARGIN, EXCISION: -    Breast tissue with no malignancy  identified.   ONCOLOGY TABLE:   INVASIVE CARCINOMA OF THE BREAST:  Resection  Procedure: Excision (less than total mastectomy) Specimen Laterality: Left Histologic Type: Invasive carcinoma with medullary features. Histologic Grade:      Glandular (Acinar)/Tubular Differentiation: Score 3      Nuclear Pleomorphism: Score 3      Mitotic Rate: Score 3  Overall Grade: Grade 3 Tumor Size:  33 mm in greatest dimension Ductal Carcinoma In Situ: Not identified. Tumor Extent: Not applicable (skin, nipple and skeletal muscle are absent (nipple and skeletal muscle) or uninvolved (skin)) Treatment Effect in the Breast: No known presurgical therapy Margins: Invasive carcinoma present at margin      Specify Margin involved by invasive carcinoma: Superior margin, 0.6 mm focus. DCIS Margins: Not applicable Regional Lymph Nodes: Regional lymph nodes present. Tumor present in regional lymph node.      Number of Lymph Nodes Examined: 6      Number of Sentinel Nodes Examined: 6      Number of Lymph Nodes with Macrometastases (>2 mm): 1      Number of Lymph Nodes with Micrometastases: 0      Number of Lymph Nodes with Isolated Tumor Cells (=0.2 mm or =200 cells): 0      Size of Largest Metastatic Deposit (mm): Not applicable      Extranodal Extension: Not applicable Distant Metastasis:      Distant Site(s) Involved: Not applicable Pathologic Stage Classification (pTNM, AJCC 8th Edition): pT2, pN1a Representative Tumor Block: A1 Comment(s):  Foci of suspected squamous differentiation are p40 non-reactive (squamous marker).   Estrogen Receptor:       Negative  Progesterone Receptor:   Negative  Proliferation Marker Ki-67:   40%  GROUP 5:   HER2 **NEGATIVE**   ASSESSMENT and THERAPY PLAN:   Malignant neoplasm of upper-outer quadrant of left breast in female, estrogen receptor negative (HCC) Forrest is a 58 year old woman with clinical stage IIb triple negative breast cancer diagnosed in  January 2023, currently receiving neoadjuvant chemotherapy.  01/2021: Left upper outer quadrant biopsy shows grade 3, 3 cm invasive ductal carcinoma triple negative.  Treatment Plan:  1. Neoadjuvant chemotherapy with pembrolizumab , carboplatin , Taxol  followed by pembrolizumab , Adriamycin, Cytoxan. 2. Followed by breast conserving surgery with sentinel lymph node study 3. Followed by adjuvant radiation therapy No role for antiestrogen therapy since this is a triple negative breast cancer 4. Adjuvant immunotherapy for 9 cycles.  She completed C4D8 and cancelled her last treatment of carbotaxol. She went on to surgery because chemo side effects were intolerable. Final pathology showed IDC with medullary features, one LN with macromets, prognostics are triple neg. We discussed standard of care is adj radiation and adj immunotherapy We discussed that the risk of recurrence is higher without adjuvant treatment, and occasionally breast cancer can return in distant organs and this unfortunately becomes metastatic breast cancer and this is incurable. She refused any further treatment and is now here for follow-up for surveillance.    Assessment and Plan Assessment & Plan Triple negative breast cancer No concern for recurrence based on ROS and PE Considered ctDNA testing for recurrence detection. She is agreeable - Perform ctDNA blood test today and repeat every six months. - Encourage scheduling of mammogram. - RTC in 6 months or sooner for follow up.    Total encounter time: 30 minutes in face-to-face visit time, chart review, lab review, care coordination, order entry, and documentation of the encounter.  *Total Encounter Time as defined by the Centers for Medicare and Medicaid Services includes, in addition to the face-to-face time of a patient visit (documented in the note above) non-face-to-face time: obtaining and reviewing outside history, ordering and reviewing medications, tests or  procedures, care coordination (communications with other health care professionals or caregivers) and documentation in the medical record.

## 2023-07-01 ENCOUNTER — Telehealth: Payer: Self-pay | Admitting: *Deleted

## 2023-07-01 NOTE — Telephone Encounter (Signed)
 [facility] reveal result ( 0%) called to pt

## 2023-07-02 ENCOUNTER — Encounter: Payer: Self-pay | Admitting: Hematology and Oncology

## 2023-07-12 LAB — GUARDANT REVEAL

## 2023-07-16 ENCOUNTER — Ambulatory Visit: Payer: Self-pay | Admitting: Hematology and Oncology

## 2023-09-07 IMAGING — MR MR BREAST BILAT WO/W CM
8 of 12 series · 32 of 48 positions shown · IV contrast (gadavist)
Comparison: Prior exams including the previous breast MRI dated
03/29/2021.

CLINICAL DATA: Assess treatment response to neoadjuvant
chemotherapy. Patient diagnosed with invasive breast carcinoma in
February 2021.

EXAM:
BILATERAL BREAST MRI WITH AND WITHOUT CONTRAST
TECHNIQUE: Multiplanar, multisequence MR images of both breasts were obtained
prior to and following the intravenous administration of 5 ml of
Gadavist

[Series 2: t2_tirm_tra ipat (a-p) · axial · 3.0mm · 0.70mm/px · 1 of 58 slices shown]
[im 1/58]
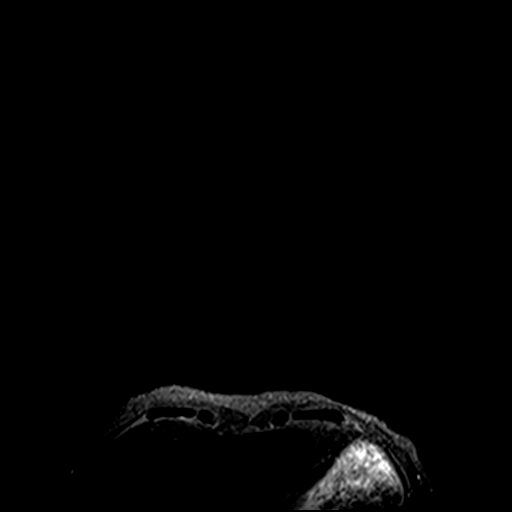

[Series 3: fl3d pre-cm no · axial · non-contrast · 1.2mm · 0.89mm/px · z∈[-53,+118]mm · 5 of 144 slices shown]
[im 1/144]
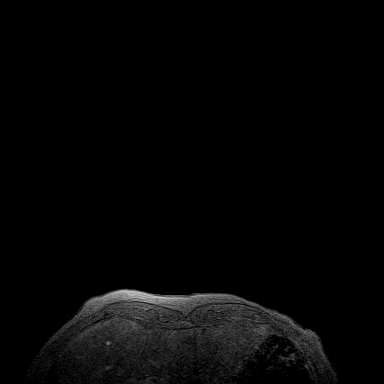
[im 36/144]
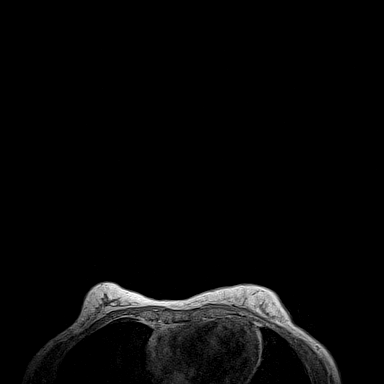
[im 72/144]
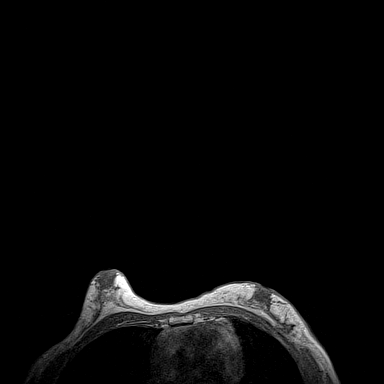
[im 108/144]
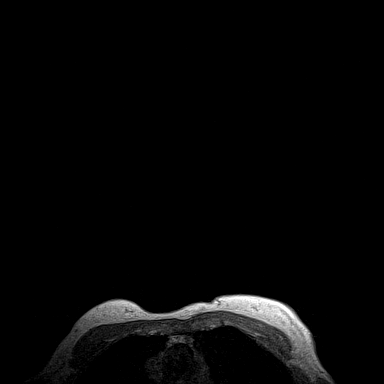
[im 144/144]
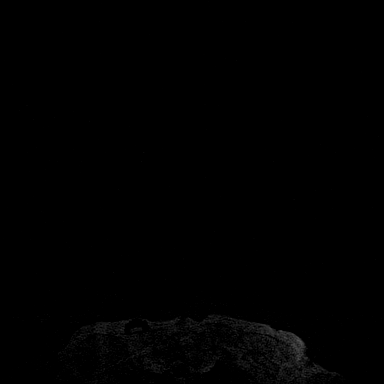

[Series 4: fl3d pre-cm · axial · non-contrast · 1.2mm · 0.89mm/px · z∈[-53,+118]mm · 5 of 144 slices shown]
[im 1/144]
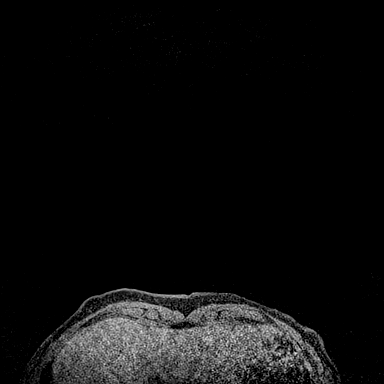
[im 36/144]
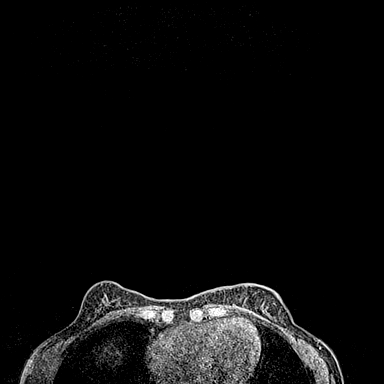
[im 72/144]
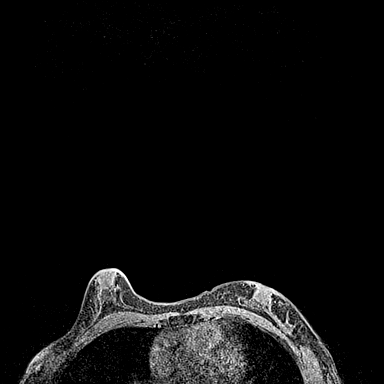
[im 108/144]
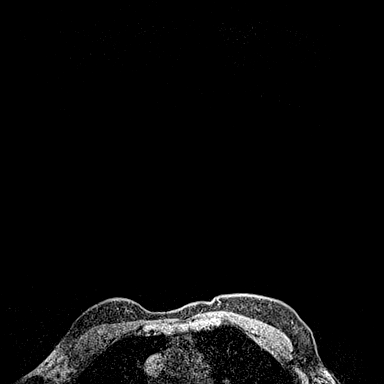
[im 144/144]
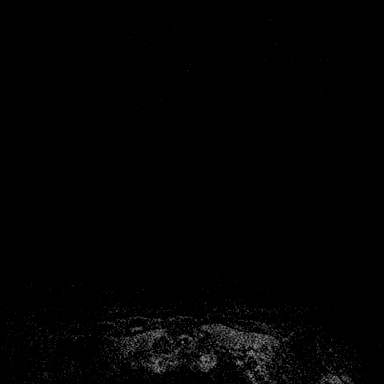

[Series 5: fl3d post-cm 20 · axial · 1.2mm · 0.89mm/px · z∈[-53,+118]mm · 5 of 144 slices shown (1 of 3)]
[im 1/144]
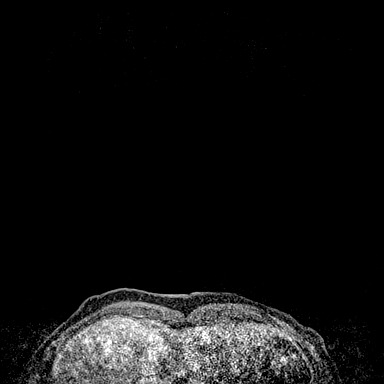
[im 36/144]
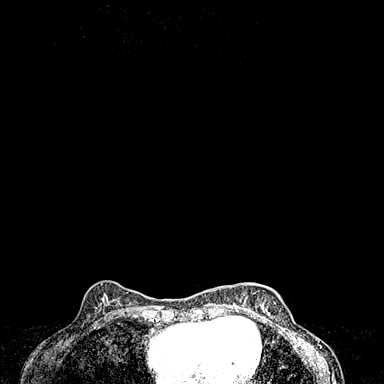
[im 72/144]
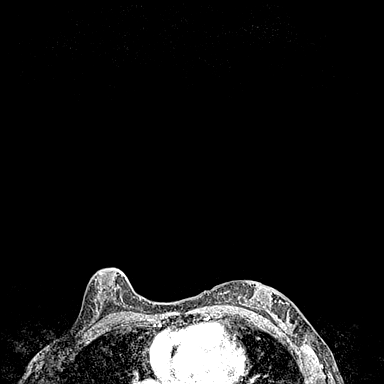
[im 108/144]
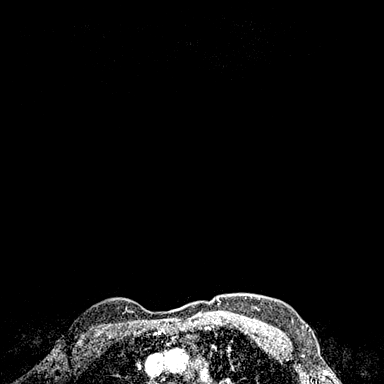
[im 144/144]
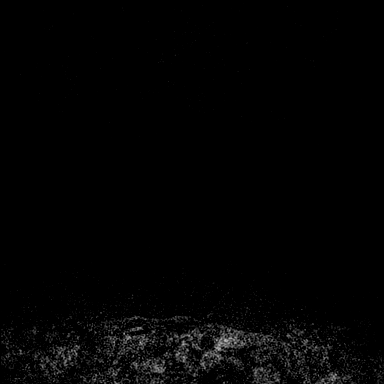

[Series 6: fl3d post-cm 20 · axial · 1.2mm · 0.89mm/px · z∈[-53,+118]mm · 5 of 144 slices shown (2 of 3)]
[im 1/144]
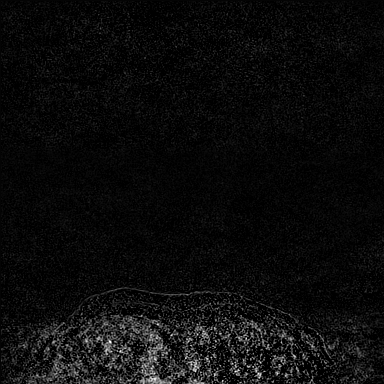
[im 36/144]
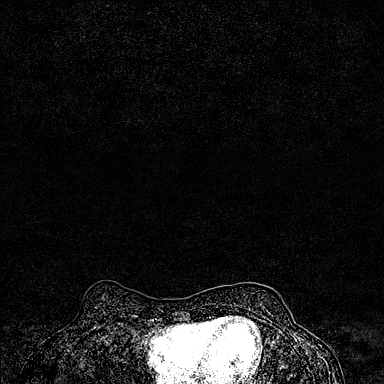
[im 72/144]
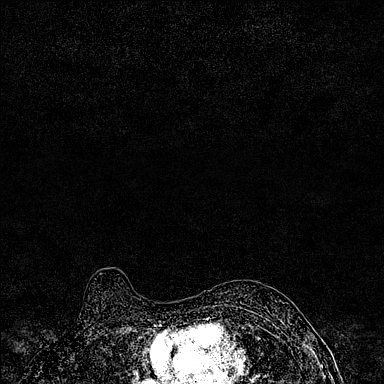
[im 108/144]
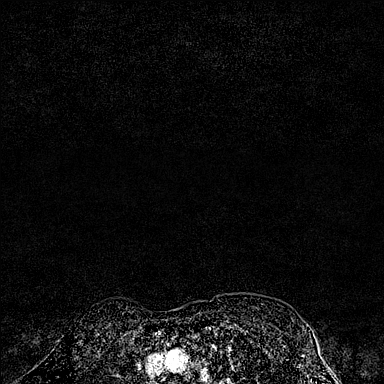
[im 144/144]
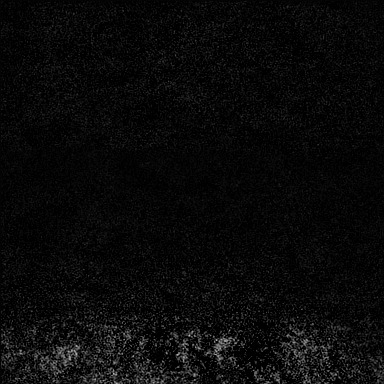

[Series 7: fl3d post-cm 20 · axial · 172.8mm · 0.89mm/px · 1 of 1 slices shown (3 of 3)]
[im 1/1]
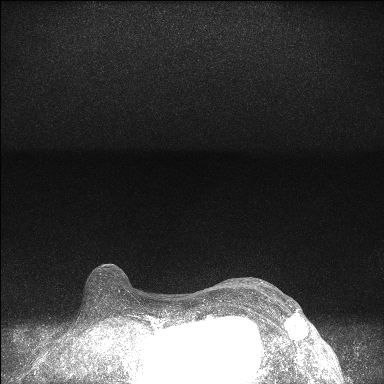

[Series 8: fl3d post-cm 3 · axial · 1.2mm · 0.89mm/px · z∈[-53,+118]mm · 6 of 144 slices shown (1 of 2)]
[im 1/144]
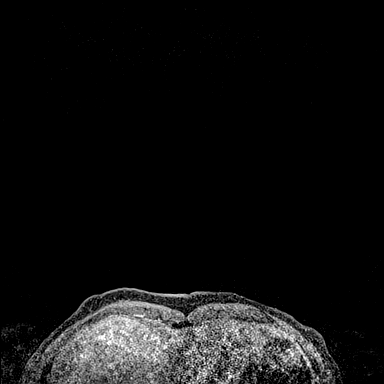
[im 29/144]
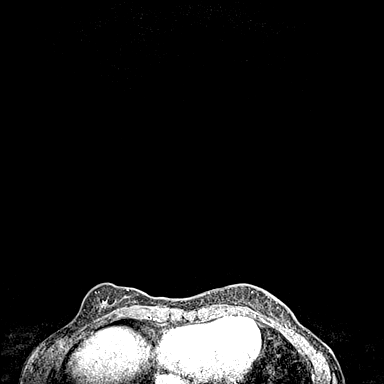
[im 58/144]
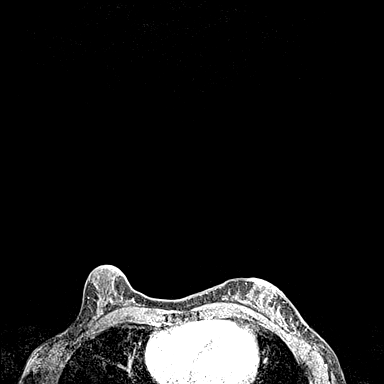
[im 86/144]
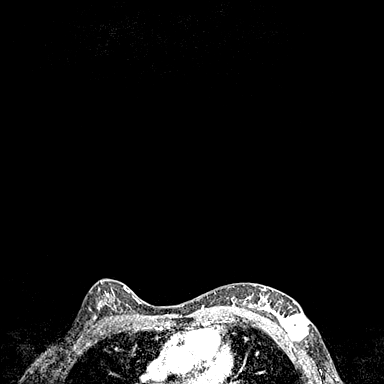
[im 115/144]
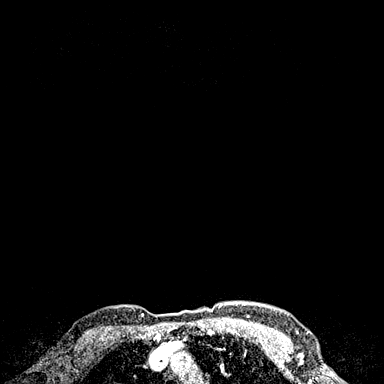
[im 144/144]
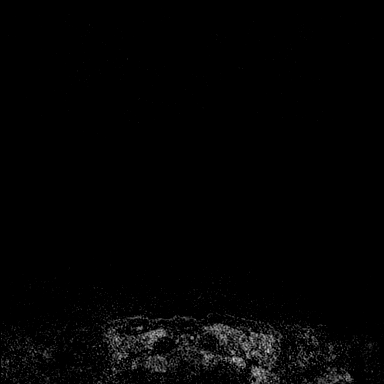

[Series 9: fl3d post-cm 3 · axial · 1.2mm · 0.89mm/px · z∈[-53,+49]mm · 4 of 144 slices shown (2 of 2)]
[im 1/144]
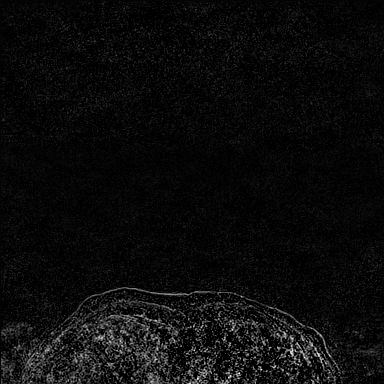
[im 29/144]
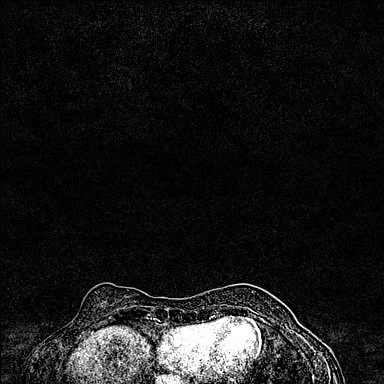
[im 58/144]
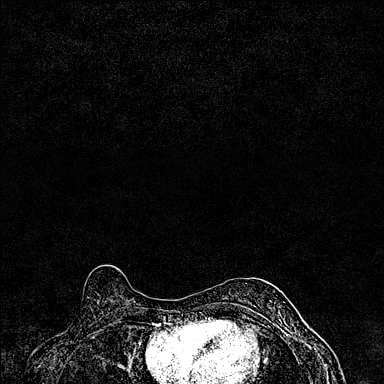
[im 86/144]
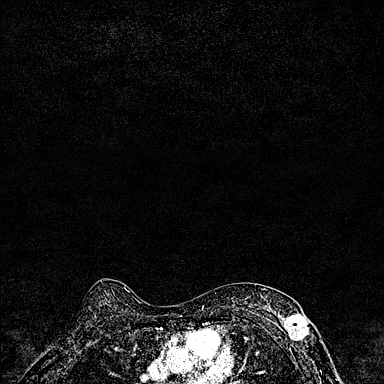

[32 of 48 positions shown; findings below may reference images not displayed]

Three-dimensional MR images were rendered by post-processing of the
original MR data on an independent workstation. The
three-dimensional MR images were interpreted, and findings are
reported in the following complete MRI report for this study. Three
dimensional images were evaluated at the independent interpreting
workstation using the DynaCAD thin client.
FINDINGS: Breast composition: c. Heterogeneous fibroglandular tissue.

Background parenchymal enhancement: Minimal

Right breast: No mass or abnormal enhancement.

Left breast: Irregular enhancing mass, reflecting the biopsy proven
malignancy, in upper outer quadrant, abutting the underlying
pectoralis muscle without evidence of invasion. There is
susceptibility artifact within the mass reflecting the post biopsy
marker clip. The mass currently measures 2.1 x 2.3 x 2.7 cm (AP x
TRV x SI), previously 4 cm x 4 cm (AP x TRV). The previously seen
non mass enhancement extending from the mass is no longer present.

There are no other left breast masses and no other areas of abnormal
enhancement.

Lymph nodes: No abnormal appearing lymph nodes.

Ancillary findings:  None.
IMPRESSION: 1. Positive response to neoadjuvant chemotherapy. The left breast
malignancy has decreased in size as detailed. Previously noted non
mass enhancement extending anterior/inferior/medial from the mass is
no longer visualized.
2. No other evidence of left breast malignancy.
3. No evidence of right breast malignancy.
4. No abnormal lymph nodes.

RECOMMENDATION:
1. Treatment as planned for the known left breast malignancy.

BI-RADS CATEGORY  6: Known biopsy-proven malignancy.

## 2023-12-22 ENCOUNTER — Telehealth: Payer: Self-pay | Admitting: Hematology and Oncology

## 2023-12-22 NOTE — Telephone Encounter (Signed)
 I returned patient's call to reschedule appointment from 12/28/2023 to 01/28/2024. Patient states she will be unable to make December appointment. Patient is aware of date/time.

## 2023-12-28 ENCOUNTER — Inpatient Hospital Stay: Payer: Self-pay | Admitting: Hematology and Oncology

## 2024-01-24 ENCOUNTER — Telehealth: Payer: Self-pay

## 2024-01-24 NOTE — Telephone Encounter (Signed)
 Called pt per MD to advise [facility] testing was negative/not detected. Pt verbalized understanding of results and knows [employer] will be in touch to schedule 6 mo repeat labs.

## 2024-01-26 ENCOUNTER — Telehealth: Payer: Self-pay

## 2024-01-26 NOTE — Telephone Encounter (Signed)
 Pt confirm appt. on 1/2

## 2024-01-28 ENCOUNTER — Inpatient Hospital Stay: Payer: Self-pay | Attending: Hematology and Oncology | Admitting: Hematology and Oncology

## 2024-01-28 VITALS — BP 102/76 | HR 87 | Temp 97.6°F | Resp 16 | Wt 131.9 lb

## 2024-01-28 DIAGNOSIS — Z803 Family history of malignant neoplasm of breast: Secondary | ICD-10-CM | POA: Insufficient documentation

## 2024-01-28 DIAGNOSIS — C773 Secondary and unspecified malignant neoplasm of axilla and upper limb lymph nodes: Secondary | ICD-10-CM | POA: Insufficient documentation

## 2024-01-28 DIAGNOSIS — Z1722 Progesterone receptor negative status: Secondary | ICD-10-CM | POA: Insufficient documentation

## 2024-01-28 DIAGNOSIS — Z1732 Human epidermal growth factor receptor 2 negative status: Secondary | ICD-10-CM | POA: Insufficient documentation

## 2024-01-28 DIAGNOSIS — C50412 Malignant neoplasm of upper-outer quadrant of left female breast: Secondary | ICD-10-CM | POA: Insufficient documentation

## 2024-01-28 DIAGNOSIS — L299 Pruritus, unspecified: Secondary | ICD-10-CM | POA: Insufficient documentation

## 2024-01-28 DIAGNOSIS — Z171 Estrogen receptor negative status [ER-]: Secondary | ICD-10-CM | POA: Insufficient documentation

## 2024-01-28 DIAGNOSIS — Z9221 Personal history of antineoplastic chemotherapy: Secondary | ICD-10-CM | POA: Insufficient documentation

## 2024-01-28 NOTE — Progress Notes (Signed)
 [facility] Cancer Follow up:    [place], [facility] [address], [address] [place] [address]   DIAGNOSIS:  Cancer Staging  Malignant neoplasm of upper-outer quadrant of left breast in female, estrogen receptor negative (HCC) Staging form: Breast, AJCC 8th Edition - Clinical stage from 03/07/2021: Stage IIB (cT2, cN0, cM0, G3, ER-, PR-, HER2-) - Signed by Odean Potts, MD on 03/07/2021 Stage prefix: Initial diagnosis Histologic grading system: 3 grade system   SUMMARY OF ONCOLOGIC HISTORY: Oncology History  Malignant neoplasm of upper-outer quadrant of left breast in female, estrogen receptor negative (HCC)  02/12/2021 Initial Diagnosis   palpable solid mass in the left upper outer quadrant. Diagnostic mammogram on 02/12/2021 showed a 3 cm mass in the left upper outer quadrant. Biopsy on 02/20/2021 showed grade 3 invasive ductal carcinoma, ER/PR/Her2-([facility])   03/07/2021 Cancer Staging   Staging form: Breast, AJCC 8th Edition - Clinical stage from 03/07/2021: Stage IIB (cT2, cN0, cM0, G3, ER-, PR-, HER2-) - Signed by Odean Potts, MD on 03/07/2021 Stage prefix: Initial diagnosis Histologic grading system: 3 grade system   03/28/2021 - 06/13/2021 Chemotherapy   Patient is on Treatment Plan : BREAST Pembrolizumab  (200) D1 + Carboplatin  (1.5) D1,8,15 + Paclitaxel  (80) D1,8,15 q21d X 4 cycles / Pembrolizumab  (200) D1 + AC D1 q21d x 4 cycles      CURRENT THERAPY: Observation.  INTERVAL HISTORY:  She didn't want to complete neoadj chemotherapy because of intense itching that almost made her cry along with some burning pain. So she went on to proceed with surgery, final pathology showed 33 mm IDC with medullary feature, one LN with metastatic carcinoma. She had re excision of margin which was finally negative. She refused additional chemo or radiation.  She presents for a routine follow-up after a six-month interval. Discussed the use of AI scribe  software for clinical note transcription with the patient, who gave verbal consent to proceed.  History of Present Illness  Amanda Smith is a 59 year old female with locally advanced malignant neoplasm of the left breast, status post breast-conserving surgery without adjuvant radiation, who presents for routine oncology surveillance.  She remains asymptomatic with no new complaints since her last visit. She denies dyspnea, gastrointestinal or genitourinary symptoms, headaches, visual changes, falls, seizures, bone pain, new or enlarging masses, abnormal bleeding, or bruising. She has not experienced constitutional symptoms such as weight loss, weight gain, fatigue, fevers, chills, or night sweats. She continues to work.  She has not yet completed her recommended surveillance mammogram  She underwent a blood-based surveillance test (Gardent/Signatera) approximately two weeks ago, which was negative. The previous blood test was performed in May of the prior year. No new imaging or interventions have been performed since her last visit.  Rest of the pertinent 10 point ROS reviewed and negative  Patient Active Problem List   Diagnosis Date Noted   Neuropathic pruritus 05/30/2021   Port-A-Cath in place 05/09/2021   Malignant neoplasm of upper-outer quadrant of left breast in female, estrogen receptor negative (HCC) 03/06/2021    is allergic to benadryl  [diphenhydramine ].  MEDICAL HISTORY: Past Medical History:  Diagnosis Date   Breast cancer [facility])     SURGICAL HISTORY: Past Surgical History:  Procedure Laterality Date   AXILLARY SENTINEL NODE BIOPSY Left 08/26/2021   Procedure: LEFT AXILLARY SENTINEL NODE BIOPSY;  Surgeon: Ebbie Cough, MD;  Location: [facility] OR;  Service: General;  Laterality: Left;   BREAST LUMPECTOMY WITH SENTINEL LYMPH NODE BIOPSY Left 08/26/2021  Procedure: LEFT BREAST LUMPECTOMY WITH, INJECTION BLUE DYE AND MAGTRACE;  Surgeon: Ebbie Cough, MD;  Location: [facility] OR;   Service: General;  Laterality: Left;  GEN w/ PEC BLOCK   CESAREAN SECTION     PORT-A-CATH REMOVAL Right 09/30/2021   Procedure: REMOVAL PORT-A-CATH;  Surgeon: Ebbie Cough, MD;  Location: [facility];  Service: General;  Laterality: Right;   PORTACATH PLACEMENT Right 03/18/2021   Procedure: INSERTION PORT-A-CATH;  Surgeon: Ebbie Cough, MD;  Location: [facility];  Service: General;  Laterality: Right;   RE-EXCISION OF BREAST LUMPECTOMY Left 09/30/2021   Procedure: RE-EXCISION OF LEFT BREAST LUMPECTOMY;  Surgeon: Ebbie Cough, MD;  Location: [facility];  Service: General;  Laterality: Left;   TUBAL LIGATION     WRIST FRACTURE SURGERY      SOCIAL HISTORY: Social History   Socioeconomic History   Marital status: Divorced    Spouse name: Not on file   Number of children: Not on file   Years of education: Not on file   Highest education level: Not on file  Occupational History   Not on file  Tobacco Use   Smoking status: Never   Smokeless tobacco: Never  Vaping Use   Vaping status: Never Used  Substance and Sexual Activity   Alcohol use: Never   Drug use: Never   Sexual activity: Yes    Birth control/protection: Post-menopausal  Other Topics Concern   Not on file  Social History Narrative   Not on file   Social Drivers of Health   Tobacco Use: Low Risk  (05/05/2022)   Received from [facility]   Patient History    Passive Exposure: Not on file    Smokeless Tobacco Use: Never    Smoking Tobacco Use: Never  Financial Resource Strain: Not on file  Food Insecurity: Not on file  Transportation Needs: Not on file  Physical Activity: Not on file  Stress: Not on file  Social Connections: Not on file  Intimate Partner Violence: Not on file  Depression (PHQ2-9): Not on file  Alcohol Screen: Not on file  Housing: Not on file  Utilities: Not on file  Health Literacy: Not on file    FAMILY  HISTORY: Family History  Problem Relation Age of Onset   Breast cancer Sister     Review of Systems  Constitutional:  Negative for appetite change, chills, fatigue, fever and unexpected weight change.  HENT:   Negative for hearing loss, lump/mass and trouble swallowing.   Eyes:  Negative for eye problems and icterus.  Respiratory:  Negative for chest tightness, cough and shortness of breath.   Cardiovascular:  Negative for chest pain, leg swelling and palpitations.  Gastrointestinal:  Negative for abdominal distention, abdominal pain, constipation, diarrhea, nausea and vomiting.  Endocrine: Negative for hot flashes.  Genitourinary:  Negative for difficulty urinating.   Musculoskeletal:  Negative for arthralgias.  Skin:  Negative for itching and rash.  Neurological:  Negative for dizziness, extremity weakness, headaches and numbness.  Hematological:  Negative for adenopathy. Does not bruise/bleed easily.  Psychiatric/Behavioral:  Negative for depression. The patient is not nervous/anxious.       PHYSICAL EXAMINATION  ECOG PERFORMANCE STATUS: 1 - Symptomatic but completely ambulatory  Vitals:   01/28/24 1524  BP: 102/76  Pulse: 87  Resp: 16  Temp: 97.6 F (36.4 C)  SpO2: 97%    Physical Exam Constitutional:      Appearance: Normal appearance.  Eyes:  General: No scleral icterus. Chest:     Comments: Left breast post surgical changes. No palpable masses or regional adenopathy Musculoskeletal:     Cervical back: Normal range of motion. No rigidity.  Lymphadenopathy:     Cervical: No cervical adenopathy.  Skin:    General: Skin is warm and dry.  Neurological:     Mental Status: She is alert.     LABORATORY DATA:  CBC    Component Value Date/Time   WBC 4.4 06/23/2023 1439   RBC 4.11 06/23/2023 1439   HGB 11.6 (L) 06/23/2023 1439   HGB 9.7 (L) 06/13/2021 0825   HCT 35.8 (L) 06/23/2023 1439   PLT 181 06/23/2023 1439   PLT 227 06/13/2021 0825   MCV 87.1  06/23/2023 1439   MCH 28.2 06/23/2023 1439   MCHC 32.4 06/23/2023 1439   RDW 13.2 06/23/2023 1439   LYMPHSABS 1.6 06/23/2023 1439   MONOABS 0.3 06/23/2023 1439   EOSABS 0.3 06/23/2023 1439   BASOSABS 0.0 06/23/2023 1439    CMP     Component Value Date/Time   NA 142 06/23/2023 1439   K 4.2 06/23/2023 1439   CL 107 06/23/2023 1439   CO2 30 06/23/2023 1439   GLUCOSE 104 (H) 06/23/2023 1439   BUN 19 06/23/2023 1439   CREATININE 0.88 06/23/2023 1439   CALCIUM 9.3 06/23/2023 1439   PROT 7.6 06/23/2023 1439   ALBUMIN 4.0 06/23/2023 1439   AST 25 06/23/2023 1439   ALT 19 06/23/2023 1439   ALKPHOS 86 06/23/2023 1439   BILITOT 0.9 06/23/2023 1439   GFRNONAA >60 06/23/2023 1439     CASE: [id number] PATIENT: Amanda Smith Surgical Pathology Report   Reason for Addendum #1:  Breast Biomarker Results Reason for Addendum #2:  Molecular Genetic Test Results, FISH  Clinical History: left breast cancer (cm)     FINAL MICROSCOPIC DIAGNOSIS:  A.   BREAST, LEFT, LUMPECTOMY: -    Invasive carcinoma with medullary features, grade 3, 33 mm in greatest dimension. -    Margin status:  Invasive carcinoma present at margin (superior), 0.6 mm focus, and at inferior resection edge (not true margin-additional inferior margin tissue submitted as Specimen H).,  B.   LYMPH NODE, LEFT AXILLARY, SENTINEL, EXCISION: -    Lymph node with metastatic carcinoma, 4 mm focus (1/1).  C.   LYMPH NODE, LEFT AXILLARY, SENTINEL, EXCISION: -    Lymph node without identified malignancy (0/1).  D.   LYMPH NODE, LEFT AXILLARY, SENTINEL, EXCISION: -    Lymph node without identified malignancy (0/1).  E.   LYMPH NODE, LEFT AXILLARY, SENTINEL, EXCISION: -    Lymph node without identified malignancy (0/1).  F.   LYMPH NODE, LEFT AXILLARY, SENTINEL, EXCISION: -    Lymph node without identified malignancy (0/1).  G.   LYMPH NODE, LEFT AXILLARY, SENTINEL, EXCISION: -    Lymph node without identified  malignancy (0/1).  H.   BREAST, LEFT ADDITIONAL INFERIOR MARGIN, EXCISION: -    Breast tissue with no malignancy identified.  I.   BREAST, LEFT ADDITIONAL MEDIAL MARGIN, EXCISION: -    Breast tissue with no malignancy identified.   ONCOLOGY TABLE:   INVASIVE CARCINOMA OF THE BREAST:  Resection  Procedure: Excision (less than total mastectomy) Specimen Laterality: Left Histologic Type: Invasive carcinoma with medullary features. Histologic Grade:      Glandular (Acinar)/Tubular Differentiation: Score 3      Nuclear Pleomorphism: Score 3      Mitotic Rate: Score 3  Overall Grade: Grade 3 Tumor Size:  33 mm in greatest dimension Ductal Carcinoma In Situ: Not identified. Tumor Extent: Not applicable (skin, nipple and skeletal muscle are absent (nipple and skeletal muscle) or uninvolved (skin)) Treatment Effect in the Breast: No known presurgical therapy Margins: Invasive carcinoma present at margin      Specify Margin involved by invasive carcinoma: Superior margin, 0.6 mm focus. DCIS Margins: Not applicable Regional Lymph Nodes: Regional lymph nodes present. Tumor present in regional lymph node.      Number of Lymph Nodes Examined: 6      Number of Sentinel Nodes Examined: 6      Number of Lymph Nodes with Macrometastases (>2 mm): 1      Number of Lymph Nodes with Micrometastases: 0      Number of Lymph Nodes with Isolated Tumor Cells (=0.2 mm or =200 cells): 0      Size of Largest Metastatic Deposit (mm): Not applicable      Extranodal Extension: Not applicable Distant Metastasis:      Distant Site(s) Involved: Not applicable Pathologic Stage Classification (pTNM, AJCC 8th Edition): pT2, pN1a Representative Tumor Block: A1 Comment(s):  Foci of suspected squamous differentiation are p40 non-reactive (squamous marker).   Estrogen Receptor:       Negative  Progesterone Receptor:   Negative  Proliferation Marker Ki-67:   40%  GROUP 5:   HER2 **NEGATIVE**    ASSESSMENT and THERAPY PLAN:   Assessment and Plan Assessment & Plan Locally advanced estrogen receptor negative breast cancer of the left upper-outer quadrant She could only complete portion of recommended chemotherapy. Declined adj radiation.  Asymptomatic. Negative ctDNA test reported. Surveillance required due to risk profile. - Ordered surveillance mammogram. Once again encouraged to complete this. - No concerns on ROS or PE today - continue annual mammogram, every 6 month ctDNA testing and FU with me in 6 months or sooner as needed. - Encouraged regular exercise   Total encounter time: 20 minutes in face-to-face visit time, chart review, lab review, care coordination, order entry, and documentation of the encounter.  *Total Encounter Time as defined by the Centers for Medicare and Medicaid Services includes, in addition to the face-to-face time of a patient visit (documented in the note above) non-face-to-face time: obtaining and reviewing outside history, ordering and reviewing medications, tests or procedures, care coordination (communications with other health care professionals or caregivers) and documentation in the medical record.

## 2024-02-02 ENCOUNTER — Other Ambulatory Visit: Payer: Self-pay

## 2024-07-27 ENCOUNTER — Inpatient Hospital Stay: Payer: Self-pay | Admitting: Hematology and Oncology

## 2024-07-27 ENCOUNTER — Inpatient Hospital Stay: Payer: Self-pay
